# Patient Record
Sex: Female | Born: 1990
Health system: Southern US, Community
[De-identification: ages and names within clinical notes are randomized; demographics above are authoritative.]

## PROBLEM LIST (undated history)

## (undated) ENCOUNTER — Emergency Department (HOSPITAL_COMMUNITY): Admission: EM | Payer: BC Managed Care – PPO

## (undated) DIAGNOSIS — F32A Depression, unspecified: Secondary | ICD-10-CM

## (undated) DIAGNOSIS — J45909 Unspecified asthma, uncomplicated: Secondary | ICD-10-CM

## (undated) DIAGNOSIS — F909 Attention-deficit hyperactivity disorder, unspecified type: Secondary | ICD-10-CM

## (undated) DIAGNOSIS — F329 Major depressive disorder, single episode, unspecified: Secondary | ICD-10-CM

## (undated) HISTORY — DX: Unspecified asthma, uncomplicated: J45.909

## (undated) HISTORY — PX: NO PAST SURGERIES: SHX2092

## (undated) HISTORY — DX: Major depressive disorder, single episode, unspecified: F32.9

## (undated) HISTORY — DX: Depression, unspecified: F32.A

## (undated) HISTORY — DX: Attention-deficit hyperactivity disorder, unspecified type: F90.9

---
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
## 2013-07-25 DIAGNOSIS — F988 Other specified behavioral and emotional disorders with onset usually occurring in childhood and adolescence: Secondary | ICD-10-CM | POA: Insufficient documentation

## 2013-07-25 DIAGNOSIS — F419 Anxiety disorder, unspecified: Secondary | ICD-10-CM | POA: Insufficient documentation

## 2013-07-25 DIAGNOSIS — F32A Depression, unspecified: Secondary | ICD-10-CM | POA: Insufficient documentation

## 2013-07-25 DIAGNOSIS — F329 Major depressive disorder, single episode, unspecified: Secondary | ICD-10-CM | POA: Insufficient documentation

## 2014-07-14 ENCOUNTER — Ambulatory Visit (INDEPENDENT_AMBULATORY_CARE_PROVIDER_SITE_OTHER): Payer: Commercial Managed Care - PPO

## 2014-07-14 ENCOUNTER — Ambulatory Visit (INDEPENDENT_AMBULATORY_CARE_PROVIDER_SITE_OTHER): Payer: Commercial Managed Care - PPO | Admitting: Family Medicine

## 2014-07-14 VITALS — BP 118/62 | HR 113 | Temp 99.2°F | Resp 16 | Wt 126.8 lb

## 2014-07-14 DIAGNOSIS — R509 Fever, unspecified: Secondary | ICD-10-CM

## 2014-07-14 DIAGNOSIS — R0602 Shortness of breath: Secondary | ICD-10-CM

## 2014-07-14 DIAGNOSIS — J101 Influenza due to other identified influenza virus with other respiratory manifestations: Secondary | ICD-10-CM

## 2014-07-14 DIAGNOSIS — R000 Tachycardia, unspecified: Secondary | ICD-10-CM

## 2014-07-14 LAB — POCT CBC
Granulocyte percent: 79.7 %G (ref 37–80)
HEMATOCRIT: 45.8 % (ref 37.7–47.9)
HEMOGLOBIN: 14.8 g/dL (ref 12.2–16.2)
LYMPH, POC: 0.8 (ref 0.6–3.4)
MCH: 28 pg (ref 27–31.2)
MCHC: 32.4 g/dL (ref 31.8–35.4)
MCV: 86.5 fL (ref 80–97)
MID (cbc): 0.2 (ref 0–0.9)
MPV: 7.5 fL (ref 0–99.8)
POC Granulocyte: 3.7 (ref 2–6.9)
POC LYMPH PERCENT: 16.8 %L (ref 10–50)
POC MID %: 3.5 %M (ref 0–12)
Platelet Count, POC: 151 10*3/uL (ref 142–424)
RBC: 5.29 M/uL (ref 4.04–5.48)
RDW, POC: 13.4 %
WBC: 4.6 10*3/uL (ref 4.6–10.2)

## 2014-07-14 LAB — POCT INFLUENZA A/B
Influenza A, POC: NEGATIVE
Influenza B, POC: POSITIVE

## 2014-07-14 MED ORDER — HYDROCOD POLST-CHLORPHEN POLST 10-8 MG/5ML PO LQCR: 5.0000 mL | Freq: Two times a day (BID) | ORAL | Status: DC | PRN

## 2014-07-14 MED ORDER — ALBUTEROL SULFATE HFA 108 (90 BASE) MCG/ACT IN AERS: 2.0000 | INHALATION_SPRAY | RESPIRATORY_TRACT | Status: DC | PRN

## 2014-07-14 MED ORDER — IPRATROPIUM BROMIDE 0.03 % NA SOLN: 2.0000 | Freq: Two times a day (BID) | NASAL | Status: DC

## 2014-07-14 NOTE — Patient Instructions (Signed)
Use cough syrup at night. atrovent nasal spray twice a day for congestion. Albuterol inhaler as needed for shortness of breath. Get plenty of rest and drink plenty of water. Return if not getting better in 5-7 days.

## 2014-07-14 NOTE — Progress Notes (Signed)
Subjective:    Patient ID: Meghan Nolan, female    DOB: [birth year], 24 y.o.   MRN: [id number]  HPI  This is a 24 year old female who is presenting with fever, cough, body aches, fatigue and dizziness x 1 week. Cough is dry but feels she needs to cough something up. Was feeling a little bit better yesterday but worse again today. States she has had some wheezing and SOB. States her mouth feels dry - "I can't get enough water". Has some upper back pain when she coughs. Taking dayquil and nyquil and helping some. Has a history of heat-induced asthma but hasn't had to use an inhaler in 6 years. No sick contacts.   Review of Systems  Constitutional: Positive for fever, chills and fatigue.  HENT: Positive for congestion. Negative for ear pain, sinus pressure and sore throat.   Eyes: Negative for redness.  Respiratory: Positive for cough, shortness of breath and wheezing.   Cardiovascular: Negative for chest pain.  Gastrointestinal: Negative for nausea, vomiting, abdominal pain and diarrhea.  Genitourinary: Negative for dysuria.  Musculoskeletal: Positive for myalgias and back pain.  Skin: Negative for rash.  Allergic/Immunologic: Negative for environmental allergies.  Neurological: Positive for dizziness and headaches.  Hematological: Negative for adenopathy.  Psychiatric/Behavioral: Positive for sleep disturbance.    There are no active problems to display for this patient.  Prior to Admission medications   Medication Sig Start Date End Date Taking? Authorizing Provider  Drospirenone-Ethinyl Estradiol-Levomefol (BEYAZ) 3-0.02-0.451 MG tablet Take 1 tablet by mouth daily.   Yes Historical Provider, MD   Allergies  Allergen Reactions  . Penicillins Shortness Of Breath   Patient's social and family history were reviewed.     Objective:   Physical Exam  Constitutional: She is oriented to person, place, and time. She appears well-developed and well-nourished. No distress.  HENT:    Head: Normocephalic and atraumatic.  Right Ear: Hearing, tympanic membrane, external ear and ear canal normal.  Left Ear: Hearing, tympanic membrane, external ear and ear canal normal.  Nose: Mucosal edema present. Right sinus exhibits no maxillary sinus tenderness and no frontal sinus tenderness. Left sinus exhibits no maxillary sinus tenderness and no frontal sinus tenderness.  Mouth/Throat: Uvula is midline, oropharynx is clear and moist and mucous membranes are normal.  Eyes: Conjunctivae and lids are normal. Right eye exhibits no discharge. Left eye exhibits no discharge. No scleral icterus.  Cardiovascular: Regular rhythm, normal heart sounds and normal pulses.  Tachycardia present.   No murmur heard. Pulmonary/Chest: Effort normal and breath sounds normal. No respiratory distress. She has no wheezes. She has no rhonchi. She has no rales.  Abdominal: Soft. Normal appearance. There is no tenderness.  Musculoskeletal: Normal range of motion.  Lymphadenopathy:       Head (right side): No submental, no submandibular, no tonsillar, no preauricular, no posterior auricular and no occipital adenopathy present.       Head (left side): No submental, no submandibular, no tonsillar, no preauricular, no posterior auricular and no occipital adenopathy present.    She has no cervical adenopathy.  Neurological: She is alert and oriented to person, place, and time.  Skin: Skin is warm, dry and intact. No lesion and no rash noted.  Psychiatric: She has a normal mood and affect. Her speech is normal and behavior is normal. Thought content normal.   BP 118/62 mmHg  Pulse 113  Temp(Src) 99.2 F (37.3 C) (Oral)  Resp 16  Wt 126 lb 12.8 oz (  57.516 kg)  SpO2 99%  LMP 07/10/2014  Pulse decreased to 98 after 1 liter IV fluids  Results for orders placed or performed in visit on 07/14/14  POCT CBC  Result Value Ref Range   WBC 4.6 4.6 - 10.2 K/uL   Lymph, poc 0.8 0.6 - 3.4   POC LYMPH PERCENT 16.8 10  - 50 %L   MID (cbc) 0.2 0 - 0.9   POC MID % 3.5 0 - 12 %M   POC Granulocyte 3.7 2 - 6.9   Granulocyte percent 79.7 37 - 80 %G   RBC 5.29 4.04 - 5.48 M/uL   Hemoglobin 14.8 12.2 - 16.2 g/dL   HCT, POC 16.145.8 09.637.7 - 47.9 %   MCV 86.5 80 - 97 fL   MCH, POC 28.0 27 - 31.2 pg   MCHC 32.4 31.8 - 35.4 g/dL   RDW, POC 04.513.4 %   Platelet Count, POC 151 142 - 424 K/uL   MPV 7.5 0 - 99.8 fL  POCT Influenza A/B  Result Value Ref Range   Influenza A, POC Negative    Influenza B, POC Positive    [facility] reading (PRIMARY) by  Dr. Patsy Lagerop[place]: negative.     Assessment & Plan:  1. Shortness of breath 2. Fever 3. Influenza B Tachycardia, dizziness and SOB improved with 1 liter IV fluids. Chest radiograph negative and cbc wnl.  Pt is out of window where tamiflu would be of benefit. Focus is on supportive care, see meds prescribed below. She will return if symptoms are not improving in 5-7 days.  - DG Chest 2 View; Future - albuterol (PROVENTIL HFA;VENTOLIN HFA) 108 (90 BASE) MCG/ACT inhaler; Inhale 2 puffs into the lungs every 4 (four) hours as needed for wheezing or shortness of breath (cough, shortness of breath or wheezing.).  Dispense: 1 Inhaler; Refill: 1 - POCT CBC - POCT Influenza A/B - chlorpheniramine-HYDROcodone (TUSSIONEX PENNKINETIC ER) 10-8 MG/5ML LQCR; Take 5 mLs by mouth every 12 (twelve) hours as needed for cough (cough).  Dispense: 80 mL; Refill: 0 - ipratropium (ATROVENT) 0.03 % nasal spray; Place 2 sprays into both nostrils 2 (two) times daily.  Dispense: 30 mL; Refill: 0   Nicole V. Dyke BrackettBush, PA-C, MHS [facility]  07/16/2014

## 2015-04-15 NOTE — L&D Delivery Note (Signed)
Operative Delivery Note At 7:38 PM a viable female was delivered via Vaginal, Vacuum Investment banker, operational(Extractor).  Presentation: vertex; Position: Left,, Occiput,, Anterior; Station: +2.  Verbal consent: obtained from patient.  Risks and benefits discussed in detail.  Risks include, but are not limited to the risks of anesthesia, bleeding, infection, damage to maternal tissues, fetal cephalhematoma.  There is also the risk of inability to effect vaginal delivery of the head, or shoulder dystocia that cannot be resolved by established maneuvers, leading to the need for emergency cesarean section.  Vacuum assistance offered as she had been pushing for 2 1/2 hours and FHT baseline gradually increasing with decreasing variability.  Vacuum used for 6 ctx, there were 2 pop-offs, vacuum in the green zone.  APGAR: pending; weight pending.   Placenta status: spontaneous, intact.   Cord:  with the following complications: .  Cord pH: pending  Anesthesia:  Epidural Instruments: Kiwi Episiotomy: None Lacerations: Periurethral Suture Repair: none Est. Blood Loss (mL): 200  Mom to postpartum.  Baby to Nursery.  Will do circumcision in the office.  Kynzie Polgar D 11/20/2015, 7:59 PM

## 2015-05-01 LAB — OB RESULTS CONSOLE ABO/RH: RH TYPE: POSITIVE

## 2015-05-01 LAB — OB RESULTS CONSOLE GC/CHLAMYDIA
Chlamydia: NEGATIVE
Gonorrhea: NEGATIVE

## 2015-05-01 LAB — OB RESULTS CONSOLE RUBELLA ANTIBODY, IGM: RUBELLA: IMMUNE

## 2015-05-01 LAB — OB RESULTS CONSOLE ANTIBODY SCREEN: ANTIBODY SCREEN: NEGATIVE

## 2015-05-01 LAB — OB RESULTS CONSOLE HIV ANTIBODY (ROUTINE TESTING): HIV: NONREACTIVE

## 2015-05-01 LAB — OB RESULTS CONSOLE RPR: RPR: NONREACTIVE

## 2015-05-01 LAB — OB RESULTS CONSOLE HEPATITIS B SURFACE ANTIGEN: Hepatitis B Surface Ag: NEGATIVE

## 2015-10-24 LAB — OB RESULTS CONSOLE GBS: STREP GROUP B AG: NEGATIVE

## 2015-11-15 ENCOUNTER — Telehealth (HOSPITAL_COMMUNITY): Payer: Self-pay | Admitting: *Deleted

## 2015-11-15 ENCOUNTER — Encounter (HOSPITAL_COMMUNITY): Payer: Self-pay | Admitting: *Deleted

## 2015-11-15 NOTE — Telephone Encounter (Signed)
Preadmission screen  

## 2015-11-20 ENCOUNTER — Encounter (HOSPITAL_COMMUNITY): Payer: Self-pay

## 2015-11-20 ENCOUNTER — Inpatient Hospital Stay (HOSPITAL_COMMUNITY)
Admission: RE | Admit: 2015-11-20 | Discharge: 2015-11-22 | DRG: 775 | Disposition: A | Discharge status: Home / Self Care | Payer: Medicaid Other | Source: Ambulatory Visit | Attending: Obstetrics and Gynecology | Admitting: Obstetrics and Gynecology

## 2015-11-20 ENCOUNTER — Inpatient Hospital Stay (HOSPITAL_COMMUNITY): Payer: Medicaid Other | Admitting: Anesthesiology

## 2015-11-20 DIAGNOSIS — Z3A39 39 weeks gestation of pregnancy: Secondary | ICD-10-CM | POA: Diagnosis not present

## 2015-11-20 DIAGNOSIS — J45909 Unspecified asthma, uncomplicated: Secondary | ICD-10-CM | POA: Diagnosis present

## 2015-11-20 DIAGNOSIS — Z3403 Encounter for supervision of normal first pregnancy, third trimester: Secondary | ICD-10-CM | POA: Diagnosis present

## 2015-11-20 DIAGNOSIS — Z87891 Personal history of nicotine dependence: Secondary | ICD-10-CM | POA: Diagnosis not present

## 2015-11-20 DIAGNOSIS — Z3493 Encounter for supervision of normal pregnancy, unspecified, third trimester: Secondary | ICD-10-CM

## 2015-11-20 DIAGNOSIS — Z8249 Family history of ischemic heart disease and other diseases of the circulatory system: Secondary | ICD-10-CM

## 2015-11-20 DIAGNOSIS — Z833 Family history of diabetes mellitus: Secondary | ICD-10-CM | POA: Diagnosis not present

## 2015-11-20 DIAGNOSIS — O9952 Diseases of the respiratory system complicating childbirth: Secondary | ICD-10-CM | POA: Diagnosis present

## 2015-11-20 DIAGNOSIS — Z832 Family history of diseases of the blood and blood-forming organs and certain disorders involving the immune mechanism: Secondary | ICD-10-CM

## 2015-11-20 LAB — CBC
HCT: 38.8 % (ref 36.0–46.0)
Hemoglobin: 13.3 g/dL (ref 12.0–15.0)
MCH: 27.9 pg (ref 26.0–34.0)
MCHC: 34.3 g/dL (ref 30.0–36.0)
MCV: 81.3 fL (ref 78.0–100.0)
PLATELETS: 167 10*3/uL (ref 150–400)
RBC: 4.77 MIL/uL (ref 3.87–5.11)
RDW: 14.3 % (ref 11.5–15.5)
WBC: 9.9 10*3/uL (ref 4.0–10.5)

## 2015-11-20 LAB — TYPE AND SCREEN
ABO/RH(D): O POS
ANTIBODY SCREEN: NEGATIVE

## 2015-11-20 LAB — ABO/RH: ABO/RH(D): O POS

## 2015-11-20 MED ORDER — MAGNESIUM HYDROXIDE 400 MG/5ML PO SUSP: 30.0000 mL | ORAL | Status: DC | PRN

## 2015-11-20 MED ORDER — OXYCODONE HCL 5 MG PO TABS
5.0000 mg | ORAL_TABLET | ORAL | Status: DC | PRN
  Administered 2015-11-20 – 2015-11-21 (×4): 5 mg via ORAL
  Filled 2015-11-20 (×3): qty 1

## 2015-11-20 MED ORDER — LACTATED RINGERS IV SOLN: 500.0000 mL | Freq: Once | INTRAVENOUS | Status: DC

## 2015-11-20 MED ORDER — LACTATED RINGERS IV SOLN
INTRAVENOUS | Status: DC
  Administered 2015-11-20 (×2): via INTRAVENOUS

## 2015-11-20 MED ORDER — ONDANSETRON HCL 4 MG/2ML IJ SOLN: 4.0000 mg | INTRAMUSCULAR | Status: DC | PRN

## 2015-11-20 MED ORDER — EPHEDRINE 5 MG/ML INJ
10.0000 mg | INTRAVENOUS | Status: DC | PRN
Start: 2015-11-20 — End: 2015-11-20
  Filled 2015-11-20: qty 4

## 2015-11-20 MED ORDER — FENTANYL 2.5 MCG/ML BUPIVACAINE 1/10 % EPIDURAL INFUSION (WH - ANES)
14.0000 mL/h | INTRAMUSCULAR | Status: DC | PRN
  Administered 2015-11-20 (×2): 14 mL/h via EPIDURAL
  Filled 2015-11-20 (×2): qty 125

## 2015-11-20 MED ORDER — BENZOCAINE-MENTHOL 20-0.5 % EX AERO
1.0000 "application " | INHALATION_SPRAY | CUTANEOUS | Status: DC | PRN
  Filled 2015-11-20: qty 56

## 2015-11-20 MED ORDER — TERBUTALINE SULFATE 1 MG/ML IJ SOLN
0.2500 mg | Freq: Once | INTRAMUSCULAR | Status: DC | PRN
  Filled 2015-11-20: qty 1

## 2015-11-20 MED ORDER — ACETAMINOPHEN 325 MG PO TABS
650.0000 mg | ORAL_TABLET | ORAL | Status: DC | PRN
  Administered 2015-11-20: 650 mg via ORAL
  Filled 2015-11-20: qty 2

## 2015-11-20 MED ORDER — SOD CITRATE-CITRIC ACID 500-334 MG/5ML PO SOLN: 30.0000 mL | ORAL | Status: DC | PRN

## 2015-11-20 MED ORDER — PHENYLEPHRINE 40 MCG/ML (10ML) SYRINGE FOR IV PUSH (FOR BLOOD PRESSURE SUPPORT)
80.0000 ug | PREFILLED_SYRINGE | INTRAVENOUS | Status: DC | PRN
  Filled 2015-11-20: qty 5

## 2015-11-20 MED ORDER — OXYCODONE HCL 5 MG PO TABS
10.0000 mg | ORAL_TABLET | ORAL | Status: DC | PRN
Start: 2015-11-20 — End: 2015-11-22
  Filled 2015-11-20: qty 2

## 2015-11-20 MED ORDER — IBUPROFEN 600 MG PO TABS
600.0000 mg | ORAL_TABLET | Freq: Four times a day (QID) | ORAL | Status: DC
  Administered 2015-11-20 – 2015-11-22 (×7): 600 mg via ORAL
  Filled 2015-11-20 (×7): qty 1

## 2015-11-20 MED ORDER — ZOLPIDEM TARTRATE 5 MG PO TABS: 5.0000 mg | ORAL_TABLET | Freq: Every evening | ORAL | Status: DC | PRN

## 2015-11-20 MED ORDER — WITCH HAZEL-GLYCERIN EX PADS: 1.0000 "application " | MEDICATED_PAD | CUTANEOUS | Status: DC | PRN

## 2015-11-20 MED ORDER — DIBUCAINE 1 % RE OINT: 1.0000 "application " | TOPICAL_OINTMENT | RECTAL | Status: DC | PRN

## 2015-11-20 MED ORDER — COCONUT OIL OIL: 1.0000 "application " | TOPICAL_OIL | Status: DC | PRN

## 2015-11-20 MED ORDER — OXYCODONE-ACETAMINOPHEN 5-325 MG PO TABS: 2.0000 | ORAL_TABLET | ORAL | Status: DC | PRN

## 2015-11-20 MED ORDER — ONDANSETRON HCL 4 MG PO TABS: 4.0000 mg | ORAL_TABLET | ORAL | Status: DC | PRN

## 2015-11-20 MED ORDER — EPHEDRINE 5 MG/ML INJ
10.0000 mg | INTRAVENOUS | Status: DC | PRN
  Filled 2015-11-20: qty 4

## 2015-11-20 MED ORDER — OXYCODONE-ACETAMINOPHEN 5-325 MG PO TABS: 1.0000 | ORAL_TABLET | ORAL | Status: DC | PRN

## 2015-11-20 MED ORDER — TETANUS-DIPHTH-ACELL PERTUSSIS 5-2.5-18.5 LF-MCG/0.5 IM SUSP
0.5000 mL | Freq: Once | INTRAMUSCULAR | Status: DC
Start: 2015-11-21 — End: 2015-11-22

## 2015-11-20 MED ORDER — PRENATAL MULTIVITAMIN CH
1.0000 | ORAL_TABLET | Freq: Every day | ORAL | Status: DC
  Administered 2015-11-21 – 2015-11-22 (×2): 1 via ORAL
  Filled 2015-11-20 (×2): qty 1

## 2015-11-20 MED ORDER — BUTORPHANOL TARTRATE 1 MG/ML IJ SOLN: 1.0000 mg | INTRAMUSCULAR | Status: DC | PRN

## 2015-11-20 MED ORDER — MEASLES, MUMPS & RUBELLA VAC ~~LOC~~ INJ: 0.5000 mL | INJECTION | Freq: Once | SUBCUTANEOUS | Status: DC

## 2015-11-20 MED ORDER — PHENYLEPHRINE 40 MCG/ML (10ML) SYRINGE FOR IV PUSH (FOR BLOOD PRESSURE SUPPORT)
80.0000 ug | PREFILLED_SYRINGE | INTRAVENOUS | Status: DC | PRN
  Filled 2015-11-20: qty 5
  Filled 2015-11-20: qty 10

## 2015-11-20 MED ORDER — OXYTOCIN 40 UNITS IN LACTATED RINGERS INFUSION - SIMPLE MED
1.0000 m[IU]/min | INTRAVENOUS | Status: DC
  Administered 2015-11-20: 2 m[IU]/min via INTRAVENOUS
  Filled 2015-11-20: qty 1000

## 2015-11-20 MED ORDER — LIDOCAINE HCL (PF) 1 % IJ SOLN
30.0000 mL | INTRAMUSCULAR | Status: DC | PRN
  Filled 2015-11-20: qty 30

## 2015-11-20 MED ORDER — SIMETHICONE 80 MG PO CHEW: 80.0000 mg | CHEWABLE_TABLET | ORAL | Status: DC | PRN

## 2015-11-20 MED ORDER — LIDOCAINE HCL (PF) 1 % IJ SOLN
INTRAMUSCULAR | Status: DC | PRN
  Administered 2015-11-20 (×2): 5 mL

## 2015-11-20 MED ORDER — DIPHENHYDRAMINE HCL 25 MG PO CAPS: 25.0000 mg | ORAL_CAPSULE | Freq: Four times a day (QID) | ORAL | Status: DC | PRN

## 2015-11-20 MED ORDER — ONDANSETRON HCL 4 MG/2ML IJ SOLN: 4.0000 mg | Freq: Four times a day (QID) | INTRAMUSCULAR | Status: DC | PRN

## 2015-11-20 MED ORDER — LACTATED RINGERS IV SOLN: 500.0000 mL | INTRAVENOUS | Status: DC | PRN

## 2015-11-20 MED ORDER — SENNOSIDES-DOCUSATE SODIUM 8.6-50 MG PO TABS
2.0000 | ORAL_TABLET | ORAL | Status: DC
  Administered 2015-11-21 – 2015-11-22 (×2): 2 via ORAL
  Filled 2015-11-20 (×2): qty 2

## 2015-11-20 MED ORDER — DIPHENHYDRAMINE HCL 50 MG/ML IJ SOLN: 12.5000 mg | INTRAMUSCULAR | Status: DC | PRN

## 2015-11-20 MED ORDER — OXYTOCIN 40 UNITS IN LACTATED RINGERS INFUSION - SIMPLE MED: 2.5000 [IU]/h | INTRAVENOUS | Status: DC

## 2015-11-20 MED ORDER — ACETAMINOPHEN 325 MG PO TABS: 650.0000 mg | ORAL_TABLET | ORAL | Status: DC | PRN

## 2015-11-20 MED ORDER — OXYTOCIN BOLUS FROM INFUSION
500.0000 mL | Freq: Once | INTRAVENOUS | Status: AC
  Administered 2015-11-20: 500 mL via INTRAVENOUS

## 2015-11-20 MED ORDER — METHYLERGONOVINE MALEATE 0.2 MG/ML IJ SOLN
0.2000 mg | INTRAMUSCULAR | Status: DC | PRN
Start: 2015-11-20 — End: 2015-11-22

## 2015-11-20 MED ORDER — METHYLERGONOVINE MALEATE 0.2 MG PO TABS: 0.2000 mg | ORAL_TABLET | ORAL | Status: DC | PRN

## 2015-11-20 NOTE — Anesthesia Pain Management Evaluation Note (Signed)
  CRNA Pain Management Visit Note  Patient: Meghan ElksJamie L Desena, 25 y.o., female  "Hello I am a [facility] the [facility]. We have an anesthesia team available at all times to provide care throughout the hospital, including epidural management and anesthesia for C-section. I don't know your plan for the delivery whether it a natural birth, water birth, IV sedation, nitrous supplementation, doula or epidural, but we want to meet your pain goals."   1.Was your pain managed to your expectations on prior hospitalizations?   No prior hospitalizations  2.What is your expectation for pain management during this hospitalization?     Epidural  3.How can we help you reach that goal? epidural  Record the patient's initial score and the patient's pain goal.   Pain: 0  Pain Goal: 5 [facility] wants you to be able to say your pain was always managed very well.  [name] 11/20/2015

## 2015-11-20 NOTE — Anesthesia Procedure Notes (Signed)
Epidural Patient location during procedure: OB  Staffing Anesthesiologist: Jullian Clayson Performed: anesthesiologist   Preanesthetic Checklist Completed: patient identified, site marked, surgical consent, pre-op evaluation, timeout performed, IV checked, risks and benefits discussed and monitors and equipment checked  Epidural Patient position: sitting Prep: DuraPrep Patient monitoring: heart rate, continuous pulse ox and blood pressure Approach: right paramedian Location: L3-L4 Injection technique: LOR saline  Needle:  Needle type: Tuohy  Needle gauge: 17 G Needle length: 9 cm and 9 Needle insertion depth: 6 cm Catheter type: closed end flexible Catheter size: 20 Guage Catheter at skin depth: 10 cm Test dose: negative  Assessment Events: blood not aspirated, injection not painful, no injection resistance, negative IV test and no paresthesia  Additional Notes Patient identified. Risks/Benefits/Options discussed with patient including but not limited to bleeding, infection, nerve damage, paralysis, failed block, incomplete pain control, headache, blood pressure changes, nausea, vomiting, reactions to medication both or allergic, itching and postpartum back pain. Confirmed with bedside nurse the patient's most recent platelet count. Confirmed with patient that they are not currently taking any anticoagulation, have any bleeding history or any family history of bleeding disorders. Patient expressed understanding and wished to proceed. All questions were answered. Sterile technique was used throughout the entire procedure. Please see nursing notes for vital signs. Test dose was given through epidural needle and negative prior to continuing to dose epidural or start infusion. Warning signs of high block given to the patient including shortness of breath, tingling/numbness in hands, complete motor block, or any concerning symptoms with instructions to call for help. Patient was given  instructions on fall risk and not to get out of bed. All questions and concerns addressed with instructions to call with any issues.     

## 2015-11-20 NOTE — Consult Note (Signed)
Neonatology Note:  Attendance at Code Apgar:   Our team responded to a Code Apgar call to room # 168 following vacuum-assisted vaginal delivery, due to infant with very poor tone and slow to breathe. The requesting physician was Dr. Meisinger. The mother is a G1P0 O pos, GBS neg with an uncomplicated pregnancy. ROM occurred 11 hours PTD and the fluid was clear. There had not been fetal distress, but there was a prolonged second stage of labor, fetal tachycardia to the low 200s, and, after delivery of the head, it took about 30 seconds before the body was delivered. At delivery, the baby was floppy. The OB nursing staff in attendance gave vigorous stimulation and gave BBO2, and a Code Apgar was called. Our team arrived at 4 minutes of life, at which time the baby was breathing regularly, had a normal HR, and was perfusing well, with pink color in BBO2. His muscle tone was very poor, and he had little response to bulb suctioning, with his eyes closed throughout. I examined him, obtained a history, and observed him until 15 minutes of life. Pulse oximetry showed his O2 saturations were in the mid 90s in room air, and his lungs were clear to auscultation. No history of maternally administered magnesium sulfate, pain medications other than epidural; her highest temperature during labor was 37.2 degrees, without suspicion for infection. Ap 4/5/6.  I spoke with the parents in the DR, leaving the pulse oximeter on the baby while he had skin to skin time. I asked his nurse to check a POCT glucose at 1 hour. I will also go back to check the baby in 30 minutes, to make sure his tone is improving. Transferred the baby to the Pediatrician's care.   Zhaire Locker C. Kamyia Thomason, MD 

## 2015-11-20 NOTE — Progress Notes (Signed)
Comfortable with epidural Afeb, VSS, BP a bit labile FHT- Cat I VE-6/90/-1, vtx Continue pitocin, monitor progress, anticipate SVD

## 2015-11-20 NOTE — H&P (Signed)
Meghan ElksJamie L Nolan is a 25 y.o. female, G1P0, EGA 39+ weeks with EDC 8-11 presenting for elective induction.  Prenatal care uncomplicated.  OB History    Gravida Para Term Preterm AB Living   1             SAB TAB Ectopic Multiple Live Births                 Past Medical History:  Diagnosis Date  . Asthma   . Depression    Past Surgical History:  Procedure Laterality Date  . NO PAST SURGERIES     Family History: family history includes Bleeding Disorder in her father; Diabetes in her mother; Heart disease in her father; Learning disabilities in her brother; Neurofibromatosis in her brother. Social History:  reports that she quit smoking about 4 months ago. She has never used smokeless tobacco. She reports that she does not drink alcohol or use drugs.     Maternal Diabetes: No Genetic Screening: Normal Maternal Ultrasounds/Referrals: Normal Fetal Ultrasounds or other Referrals:  None Maternal Substance Abuse:  No Significant Maternal Medications:  None Significant Maternal Lab Results:  Lab values include: Group B Strep negative Other Comments:  None  Review of Systems  Respiratory: Negative.   Cardiovascular: Negative.    Maternal Medical History:  Fetal activity: Perceived fetal activity is normal.    Prenatal complications: no prenatal complications  AROM-clear Dilation: 2.5 Effacement (%): 60, 70 Station: -3 Exam by:: Gifford ShaveYancey Luft RN Blood pressure 129/80, pulse 94, temperature 97.6 F (36.4 C), temperature source Oral, resp. rate 18, height 5\' 7"  (1.702 m), weight 87.1 kg (192 lb), last menstrual period 02/16/2015, SpO2 100 %. Maternal Exam:  Uterine Assessment: Contraction strength is mild.  Contraction frequency is irregular.   Abdomen: Patient reports no abdominal tenderness. Estimated fetal weight is 7 1/2 lbs.   Fetal presentation: vertex  Introitus: Normal vulva. Normal vagina.  Amniotic fluid character: clear.  Pelvis: adequate for delivery.   Cervix:  Cervix evaluated by digital exam.     Fetal Exam Fetal Monitor Review: Mode: ultrasound.   Baseline rate: 130.  Variability: moderate (6-25 bpm).   Pattern: accelerations present and no decelerations.    Fetal State Assessment: Category I - tracings are normal.     Physical Exam  Vitals reviewed. Constitutional: She appears well-developed and well-nourished.  GI: Soft.    Prenatal labs: ABO, Rh: --/--/O POS (08/08 0720) Antibody: NEG (08/08 0720) Rubella: Immune (01/17 0000) RPR: Nonreactive (01/17 0000)  HBsAg: Negative (01/17 0000)  HIV: Non-reactive (01/17 0000)  GBS: Negative (07/12 0000)   Assessment/Plan: IUP at 39+ weeks with favorable cervix for elective induction.  Pitocin has been started, AROM done, will monitor progress, anticipate SVD.   Marshal Eskew D 11/20/2015, 8:43 AM

## 2015-11-20 NOTE — Anesthesia Preprocedure Evaluation (Signed)

## 2015-11-21 LAB — RPR: RPR: NONREACTIVE

## 2015-11-21 NOTE — Progress Notes (Signed)
MOB was referred for history of depression/anxiety. * Referral screened out by Clinical Social Worker because none of the following criteria appear to apply: ~ History of anxiety/depression during this pregnancy, or of post-partum depression. ~ Diagnosis of anxiety and/or depression within last 3 years OR * MOB's symptoms currently being treated with medication and/or therapy. Please contact the Clinical Social Worker if needs arise, or if MOB requests.  CSW reviewed chart and MOB was dx with depression at age 25 and has not been on medication.   Blaine HamperAngel Boyd-Gilyard, MSW, LCSW Clinical Social Work [phone]

## 2015-11-21 NOTE — Progress Notes (Signed)
Post Partum Day 1 Subjective: no complaints, up ad lib and voiding  Baby at breast and latching well  Objective: Blood pressure 137/64, pulse 98, temperature 98.4 F (36.9 C), temperature source Oral, resp. rate 16, height 5\' 7"  (1.702 m), weight 87.1 kg (192 lb), last menstrual period 02/16/2015, SpO2 100 %, unknown if currently breastfeeding.  Physical Exam:  General: alert and cooperative Lochia: appropriate Uterine Fundus: firm    Recent Labs  11/20/15 0720  HGB 13.3  HCT 38.8    Assessment/Plan: Plan for discharge tomorrow D/w pt circumcision and plans in office  LOS: 1 day   Meghan Nolan W 11/21/2015, 10:35 AM

## 2015-11-21 NOTE — Anesthesia Postprocedure Evaluation (Signed)
Anesthesia Post Note  Patient: Meghan ElksJamie L Kanode  Procedure(s) Performed: * No procedures listed *  Patient location during evaluation: Mother Baby Anesthesia Type: Epidural Level of consciousness: awake and alert Pain management: pain level controlled Vital Signs Assessment: post-procedure vital signs reviewed and stable Respiratory status: spontaneous breathing, nonlabored ventilation and respiratory function stable Cardiovascular status: stable Postop Assessment: no headache, no backache and epidural receding Anesthetic complications: no     Last Vitals:  Vitals:   11/20/15 2300 11/21/15 0333  BP: 137/60 137/64  Pulse: 99 98  Resp: 16 16  Temp: 36.7 C 36.9 C    Last Pain:  Vitals:   11/21/15 0524  TempSrc:   PainSc: 3    Pain Goal: Patients Stated Pain Goal: 2 (11/21/15 0524)               Junious SilkGILBERT,Ohanna Gassert

## 2015-11-21 NOTE — Lactation Note (Signed)
This note was copied from a baby's chart. Lactation Consultation Note  Patient Name: Meghan Nolan ZOXWR'UToday's Date: 11/21/2015 Reason for consult: Initial assessment Initial visit done.  This is mom's first baby.  Delivery was a difficult vacuum extraction.  Mom states baby is latching easily and well.  Baby just finished a feeding and is relaxed and content.  Encouraged to watch for feeding cues and to call for assist/concerns prn.  Maternal Data    Feeding    LATCH Score/Interventions                      Lactation Tools Discussed/Used     Consult Status Consult Status: Follow-up Date: 11/22/15 Follow-up type: In-patient    Huston FoleyMOULDEN, Larita Deremer S 11/21/2015, 4:01 PM

## 2015-11-22 MED ORDER — IBUPROFEN 600 MG PO TABS: 600.0000 mg | ORAL_TABLET | Freq: Four times a day (QID) | ORAL | 1 refills | Status: DC

## 2015-11-22 NOTE — Discharge Summary (Addendum)
OB Discharge Summary     Patient Name: Meghan ElksJamie L Howerter DOB: 1990-08-04 MRN: [id number]  Date of admission: 11/20/2015 Delivering MD: Jackelyn KnifeMEISINGER, TODD   Date of discharge: 11/22/2015  Admitting diagnosis: INDUCTION Intrauterine pregnancy: 8180w4d     Secondary diagnosis:  Active Problems:   Normal pregnancy in third trimester  Additional problems: none     Discharge diagnosis: Term Pregnancy Delivered                                                                                                Post partum procedures:none  Augmentation: AROM and Pitocin  Complications: vacuum assisted delivery  Hospital course:  Induction of Labor With Vaginal Delivery   25 y.o. yo G1P1001 at 5380w4d was admitted to the hospital 11/20/2015 for induction of labor.  Indication for induction: elective.  Patient had an uncomplicated labor course as follows: Membrane Rupture Time/Date: 8:35 AM ,11/20/2015   Intrapartum Procedures: Episiotomy: None [1]                                         Lacerations:  Periurethral [8]  Patient had delivery of a Viable infant.  Information for the patient's newborn:  Eda KeysVestal, Boy Subrina [[id number]][[id number]]      11/20/2015  Details of delivery can be found in separate delivery note.  Patient had a routine postpartum course. Patient is discharged home 11/22/15.   Physical exam Vitals:   11/21/15 1100 11/21/15 1754 11/22/15 0557 11/22/15 0821  BP: 124/76 138/62 (!) 97/48 111/68  Pulse: 77 (!) 101 91 99  Resp: 18 17 18    Temp: 98.7 F (37.1 C) 99.1 F (37.3 C) 98.6 F (37 C)   TempSrc: Oral Oral Oral   SpO2:      Weight:      Height:       General: alert, cooperative and no distress Lochia: appropriate Uterine Fundus: firm Incision: N/A DVT Evaluation: No evidence of DVT seen on physical exam. No significant calf/ankle edema. Labs: Lab Results  Component Value Date   WBC 9.9 11/20/2015   HGB 13.3 11/20/2015   HCT 38.8 11/20/2015   MCV 81.3 11/20/2015   PLT 167  11/20/2015   No flowsheet data found.  Discharge instruction: per After Visit Summary and "Baby and Me Booklet".  After visit meds:    Medication List    TAKE these medications   calcium carbonate 500 MG chewable tablet Commonly known as:  TUMS - dosed in mg elemental calcium Chew 1 tablet by mouth daily as needed for indigestion or heartburn.   ibuprofen 600 MG tablet Commonly known as:  ADVIL,MOTRIN Take 1 tablet (600 mg total) by mouth every 6 (six) hours.       Diet: routine diet  Activity: Advance as tolerated. Pelvic rest for 6 weeks.   Outpatient follow up:6 weeks Follow up Appt:No future appointments. Follow up Visit:No Follow-up on file.  Postpartum contraception: Undecided  Newborn Data: Live born female  Birth Weight: 8 lb 7 oz (  3827 g) APGAR: 4, 5  Baby Feeding: Breast Disposition:rooming in   11/22/2015 [facility], DO

## 2015-11-22 NOTE — Discharge Instructions (Signed)
Nothing in vagina for 6 weeks.  No sex, tampons, and douching.  Other instructions as in [facility] Discharge Booklet. °

## 2015-11-22 NOTE — Lactation Note (Addendum)
This note was copied from a baby's chart. Lactation Consultation Note  Patient Name: Meghan Marylee FlorasJamie Cobbs ZOXWR'UToday's Date: 11/22/2015 Reason for consult: Follow-up assessment Baby 36 hours old. Baby nursing well at right breast in a modified laid back position. Baby latched deeply, suckling rhythmically with lips flanged and intermittent swallows noted. Enc mom to nurse through the night, discussing with mom that baby's get up to 50% of their calories through the night. Enc offering lots of STS and nursing with cues. Discussed nursing/pumping and returning to work, and [employer]BP purchase options. Enc mom to check with WIC first as mom states that she has pregnancy Medicaid. Discussed milk coming to volume, and referred mom to Baby and Me booklet for number of diapers to expect by day of life and EBM storage guidelines. Mom aware of OP/BFSG and LC phone line assistance after D/C.   Maternal Data    Feeding Feeding Type: Breast Fed Length of feed: 20 min  LATCH Score/Interventions Latch: Grasps breast easily, tongue down, lips flanged, rhythmical sucking.  Audible Swallowing: Spontaneous and intermittent  Type of Nipple: Everted at rest and after stimulation  Comfort (Breast/Nipple): Soft / non-tender     Hold (Positioning): No assistance needed to correctly position infant at breast.  LATCH Score: 10  Lactation Tools Discussed/Used     Consult Status Consult Status: PRN    Geralynn OchsWILLIARD, Lai Hendriks 11/22/2015, 9:01 AM

## 2015-11-22 NOTE — Progress Notes (Addendum)
Post Partum Day 2 Subjective: up ad lib, voiding, tolerating PO and crampy with breastfeeds. Would like to stay as baby not being discharged to home today - bili lights. Breastfeeding  Objective: Blood pressure 111/68, pulse 99, temperature 98.6 F (37 C), temperature source Oral, resp. rate 18, height 5\' 7"  (1.702 m), weight 192 lb (87.1 kg), last menstrual period 02/16/2015, SpO2 100 %, unknown if currently breastfeeding.  Physical Exam:  General: alert, cooperative and no distress Lochia: appropriate Uterine Fundus: firm Incision: n/a DVT Evaluation: No evidence of DVT seen on physical exam. No significant calf/ankle edema.   Recent Labs  11/20/15 0720  HGB 13.3  HCT 38.8    Assessment/Plan: Discharge home, Breastfeeding and Contraception unsure; will nest overnight   LOS: 2 days   Sharol Givenecilia Worema Banga 11/22/2015, 10:07 AM

## 2015-11-23 ENCOUNTER — Ambulatory Visit: Payer: Self-pay

## 2015-11-23 ENCOUNTER — Inpatient Hospital Stay (HOSPITAL_COMMUNITY): Admission: AD | Admit: 2015-11-23 | Payer: Self-pay | Source: Ambulatory Visit | Admitting: Obstetrics and Gynecology

## 2015-11-23 NOTE — Lactation Note (Signed)
This note was copied from a baby's chart. Lactation Consultation Note  Baby latches easily.  Sucks and swallows observed. Mother's breasts are engorged.  Provided ice packs and hand pump and reviewed engorgement care including massage. Mom encouraged to feed baby 8-12 times/24 hours and with feeding cues.  Due to jaundice - explained to parents to be proactive with feedings - waking to breastfeed, be sure to wake between breasts, burp, change diaper. Parents seem confident with feedings.  Praised them for their efforts. Monitor voids/stools per day of life.  Call if they have further questions.  Patient Name: Meghan Marylee FlorasJamie Mccutcheon AVWUJ'WToday's Date: 11/23/2015 Reason for consult: Follow-up assessment   Maternal Data    Feeding Feeding Type: Breast Fed Length of feed: 15 min  LATCH Score/Interventions Latch: Grasps breast easily, tongue down, lips flanged, rhythmical sucking.  Audible Swallowing: Spontaneous and intermittent  Type of Nipple: Everted at rest and after stimulation  Comfort (Breast/Nipple): Filling, red/small blisters or bruises, mild/mod discomfort  Problem noted: Mild/Moderate discomfort;Filling Interventions (Filling): Massage;Reverse pressure;Hand pump Interventions (Mild/moderate discomfort):  (ice)  Hold (Positioning): No assistance needed to correctly position infant at breast.  LATCH Score: 9  Lactation Tools Discussed/Used     Consult Status Consult Status: Complete    Hardie PulleyBerkelhammer, Ruth Boschen 11/23/2015, 9:29 AM

## 2016-01-01 ENCOUNTER — Other Ambulatory Visit: Payer: Self-pay | Admitting: Obstetrics and Gynecology

## 2016-01-01 DIAGNOSIS — N6310 Unspecified lump in the right breast, unspecified quadrant: Secondary | ICD-10-CM

## 2016-01-17 ENCOUNTER — Ambulatory Visit
Admission: RE | Admit: 2016-01-17 | Discharge: 2016-01-17 | Disposition: A | Discharge status: Home / Self Care | Payer: Medicaid Other | Source: Ambulatory Visit | Attending: Obstetrics and Gynecology | Admitting: Obstetrics and Gynecology

## 2016-01-17 ENCOUNTER — Other Ambulatory Visit: Payer: Self-pay | Admitting: Obstetrics and Gynecology

## 2016-01-17 DIAGNOSIS — N6310 Unspecified lump in the right breast, unspecified quadrant: Secondary | ICD-10-CM

## 2016-01-24 ENCOUNTER — Other Ambulatory Visit: Payer: Medicaid Other

## 2018-11-24 ENCOUNTER — Encounter: Payer: Self-pay | Admitting: Registered Nurse

## 2018-11-24 ENCOUNTER — Ambulatory Visit (INDEPENDENT_AMBULATORY_CARE_PROVIDER_SITE_OTHER): Payer: No Typology Code available for payment source | Admitting: Registered Nurse

## 2018-11-24 ENCOUNTER — Other Ambulatory Visit: Payer: Self-pay

## 2018-11-24 VITALS — BP 114/70 | HR 83 | Temp 98.8°F | Resp 16 | Ht 65.95 in | Wt 159.0 lb

## 2018-11-24 DIAGNOSIS — Z13220 Encounter for screening for lipoid disorders: Secondary | ICD-10-CM | POA: Diagnosis not present

## 2018-11-24 DIAGNOSIS — Z130 Encounter for screening for diseases of the blood and blood-forming organs and certain disorders involving the immune mechanism: Secondary | ICD-10-CM

## 2018-11-24 DIAGNOSIS — F988 Other specified behavioral and emotional disorders with onset usually occurring in childhood and adolescence: Secondary | ICD-10-CM | POA: Diagnosis not present

## 2018-11-24 DIAGNOSIS — Z13228 Encounter for screening for other metabolic disorders: Secondary | ICD-10-CM

## 2018-11-24 DIAGNOSIS — Z7689 Persons encountering health services in other specified circumstances: Secondary | ICD-10-CM

## 2018-11-24 DIAGNOSIS — Z1329 Encounter for screening for other suspected endocrine disorder: Secondary | ICD-10-CM | POA: Diagnosis not present

## 2018-11-24 MED ORDER — LISDEXAMFETAMINE DIMESYLATE 10 MG PO CAPS: ORAL_CAPSULE | ORAL | 0 refills | Status: DC

## 2018-11-24 NOTE — Progress Notes (Signed)
New Patient Office Visit  Subjective:  Patient ID: Meghan Nolan, female    DOB: Jun 14, 1990  Age: 28 y.o. MRN: [id number]  CC:  Chief Complaint  Patient presents with  . Establish Care    pt needs pcp to manage ADD and start back on medication   . ADD    HPI Meghan Nolan presents for visit to establish care.   She has a history of ADD for which she has taken Vyvanse with good effect. She is interested in restarting this today. PDMP consulted, no current prescriptions.  Otherwise, she states that she has not had insurance in some time, as such[facility]. 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 She will return to our clinic to have a CPE and pap in September.   Otherwise, no complaints today. Overall feels healthy. Somewhat stressed by pandemic. Unfortunately went through a break up prior to the pandemic - but luckily her 713 yo son is doing well.   Past Medical History:  Diagnosis Date  . ADHD (attention deficit hyperactivity disorder)   . Asthma   . Depression     Past Surgical History:  Procedure Laterality Date  . NO PAST SURGERIES      Family History  Problem Relation Age of Onset  . Diabetes Mother   . Breast cancer Mother   . Colon polyps Mother   . Heart disease Father   . Bleeding Disorder Father   . Diabetes Father   . Neurofibromatosis Brother   . Learning disabilities Brother     Social History   Socioeconomic History  . Marital status: Single    Spouse name: Not on file  . Number of children: 1  . Years of education: Not on file  . Highest education level: Not on file  Occupational History  . Not on file  Social Needs  . Financial resource strain: Not hard at all  . Food insecurity    Worry: Never true    Inability: Never true  . Transportation needs    Medical: No    Non-medical: No  Tobacco Use  . Smoking status: Former Smoker    Quit date: 07/16/2015    Years since  quitting: 3.3  . Smokeless tobacco: Never Used  Substance and Sexual Activity  . Alcohol use: No    Alcohol/week: 0.0 standard drinks  . Drug use: No  . Sexual activity: Not Currently  Lifestyle  . Physical activity    Days per week: 5 days    Minutes per session: 30 min  . Stress: To some extent  Relationships  . Social Musicianconnections    Talks on phone: Three times a week    Gets together: Twice a week    Attends religious service: Patient refused    Active member of club or organization: Patient refused    Attends meetings of clubs or organizations: Patient refused    Relationship status: Patient refused  . Intimate partner violence    Fear of current or ex partner: No    Emotionally abused: No    Physically abused: No    Forced sexual activity: No  Other Topics Concern  . Not on file  Social History Narrative  . Not on file    ROS Review of Systems  Constitutional: Negative.   HENT: Negative.   Eyes: Negative.   Respiratory: Negative.   Cardiovascular: Negative.   Gastrointestinal:  Negative.   Endocrine: Negative.   Genitourinary: Negative.   Musculoskeletal: Negative.   Skin: Negative.   Allergic/Immunologic: Negative.   Neurological: Negative.   Hematological: Negative.   Psychiatric/Behavioral: Positive for decreased concentration. The patient is nervous/anxious and is hyperactive.   All other systems reviewed and are negative.   Objective:   Today's Vitals: BP 114/70   Pulse 83   Temp 98.8 F (37.1 C) (Oral)   Resp 16   Ht 5' 5.95" (1.675 m)   Wt 159 lb (72.1 kg)   SpO2 96%   BMI 25.71 kg/m   Physical Exam Vitals signs and nursing note reviewed.  Constitutional:      General: She is not in acute distress.    Appearance: She is normal weight. She is not ill-appearing, toxic-appearing or diaphoretic.  Cardiovascular:     Rate and Rhythm: Regular rhythm.  Pulmonary:     Effort: Pulmonary effort is normal. No respiratory distress.  Neurological:      Mental Status: She is alert and oriented to person, place, and time. Mental status is at baseline.  Psychiatric:        Mood and Affect: Mood normal.        Behavior: Behavior normal.        Thought Content: Thought content normal.        Judgment: Judgment normal.     Assessment & Plan:   Problem List Items Addressed This Visit      Other   ADD (attention deficit disorder) - Primary   Relevant Medications   lisdexamfetamine (VYVANSE) 10 MG capsule      Outpatient Encounter Medications as of 11/24/2018  Medication Sig  . ibuprofen (ADVIL,MOTRIN) 600 MG tablet Take 1 tablet (600 mg total) by mouth every 6 (six) hours.  Marland Kitchen lisdexamfetamine (VYVANSE) 10 MG capsule Take 1 PO daily in AM. May take 1 PO around noon if effect is limited.  . medroxyPROGESTERone Acetate (DEPO-PROVERA) 150 MG/ML SUSY Depo-Provera 150 mg/mL intramuscular syringe  Inject 1 mL every 3 months by intramuscular route for 90 days.  . [DISCONTINUED] calcium carbonate (TUMS - DOSED IN MG ELEMENTAL CALCIUM) 500 MG chewable tablet Chew 1 tablet by mouth daily as needed for indigestion or heartburn.  . [DISCONTINUED] lisdexamfetamine (VYVANSE) 10 MG capsule Take 3 po daily in AM   No facility-administered encounter medications on file as of 11/24/2018.     Follow-up: Return in about 5 weeks (around 12/29/2018) for CPE with Pap.   PLAN  Labs drawn, will follow up as warranted.  Restarted Vyvanse. Will start at 10mg  PO qd in AM for 1 week for tolerance. Suggest that she may take an additional tab around noon should she feel the effect waning after first week. Will follow up at CPE in September and discuss increasing dose at that time.  Patient encouraged to call clinic with any questions, comments, or concerns.   Maximiano Coss, NP

## 2018-11-24 NOTE — Patient Instructions (Signed)
° ° ° °  If you have lab work done today you will be contacted with your lab results within the next 2 weeks.  If you have not heard from us then please contact us. The fastest way to get your results is to register for My Chart. ° ° °IF you received an x-ray today, you will receive an invoice from [facility]. Please contact [facility] at [phone] with questions or concerns regarding your invoice.  ° °IF you received labwork today, you will receive an invoice from [facility]. Please contact [facility] at [phone] with questions or concerns regarding your invoice.  ° °Our billing staff will not be able to assist you with questions regarding bills from these companies. ° °You will be contacted with the lab results as soon as they are available. The fastest way to get your results is to activate your My Chart account. Instructions are located on the last page of this paperwork. If you have not heard from us regarding the results in 2 weeks, please contact this office. °  ° ° ° °

## 2018-11-25 ENCOUNTER — Telehealth: Payer: Self-pay | Admitting: Registered Nurse

## 2018-11-25 ENCOUNTER — Other Ambulatory Visit: Payer: Self-pay | Admitting: Registered Nurse

## 2018-11-25 DIAGNOSIS — F988 Other specified behavioral and emotional disorders with onset usually occurring in childhood and adolescence: Secondary | ICD-10-CM

## 2018-11-25 LAB — COMPREHENSIVE METABOLIC PANEL
ALT: 11 IU/L (ref 0–32)
AST: 16 IU/L (ref 0–40)
Albumin/Globulin Ratio: 2.4 — ABNORMAL HIGH (ref 1.2–2.2)
Albumin: 4.8 g/dL (ref 3.9–5.0)
Alkaline Phosphatase: 60 IU/L (ref 39–117)
BUN/Creatinine Ratio: 10 (ref 9–23)
BUN: 7 mg/dL (ref 6–20)
Bilirubin Total: 0.7 mg/dL (ref 0.0–1.2)
CO2: 21 mmol/L (ref 20–29)
Calcium: 9.5 mg/dL (ref 8.7–10.2)
Chloride: 103 mmol/L (ref 96–106)
Creatinine, Ser: 0.67 mg/dL (ref 0.57–1.00)
GFR calc Af Amer: 137 mL/min/{1.73_m2} (ref >59)
GFR calc non Af Amer: 119 mL/min/{1.73_m2} (ref >59)
Globulin, Total: 2 g/dL (ref 1.5–4.5)
Glucose: 89 mg/dL (ref 65–99)
Potassium: 3.8 mmol/L (ref 3.5–5.2)
Sodium: 140 mmol/L (ref 134–144)
Total Protein: 6.8 g/dL (ref 6.0–8.5)

## 2018-11-25 LAB — CBC
Hematocrit: 44.7 % (ref 34.0–46.6)
Hemoglobin: 14.3 g/dL (ref 11.1–15.9)
MCH: 28.4 pg (ref 26.6–33.0)
MCHC: 32 g/dL (ref 31.5–35.7)
MCV: 89 fL (ref 79–97)
Platelets: 191 10*3/uL (ref 150–450)
RBC: 5.03 x10E6/uL (ref 3.77–5.28)
RDW: 13 % (ref 11.7–15.4)
WBC: 6.1 10*3/uL (ref 3.4–10.8)

## 2018-11-25 LAB — LIPID PANEL
Chol/HDL Ratio: 3 ratio (ref 0.0–4.4)
Cholesterol, Total: 146 mg/dL (ref 100–199)
HDL: 48 mg/dL (ref >39)
LDL Calculated: 89 mg/dL (ref 0–99)
Triglycerides: 46 mg/dL (ref 0–149)
VLDL Cholesterol Cal: 9 mg/dL (ref 5–40)

## 2018-11-25 LAB — HEMOGLOBIN A1C
Est. average glucose Bld gHb Est-mCnc: 103 mg/dL
Hgb A1c MFr Bld: 5.2 % (ref 4.8–5.6)

## 2018-11-25 LAB — TSH: TSH: 0.618 u[IU]/mL (ref 0.450–4.500)

## 2018-11-25 MED ORDER — AMPHETAMINE-DEXTROAMPHETAMINE 10 MG PO TABS: 10.0000 mg | ORAL_TABLET | Freq: Two times a day (BID) | ORAL | 0 refills | Status: DC

## 2018-11-25 NOTE — Telephone Encounter (Signed)
Copied from [place] [id number]. Topic: General - Other >> Nov 25, 2018 10:09 AM Yvette Rack wrote: Reason for CRM: Pt stated her insurance will not cover Rx for lisdexamfetamine but she was told they would cover Vyvanse or a generic medication. Pt requests call back.

## 2018-11-25 NOTE — Telephone Encounter (Signed)
Copied from CRM #[id number]. Topic: General - Other °>> Nov 25, 2018 10:09 AM Mcneil, Ja-Kwan wrote: °Reason for CRM: Pt stated her insurance will not cover Rx for lisdexamfetamine but she was told they would cover Vyvanse or a generic medication. Pt requests call back. °

## 2018-11-25 NOTE — Progress Notes (Signed)
Good afternoon Ms Cockerham, Your lab results have returned. They are looking excellent. The only "abnormality" is a mild elevation in your albumin/globulin ratio, caused by some dehydration. It's summertime in the [place], that's reasonable! Otherwise, normal blood counts, your liver and kidneys seem to be doing well, your cholesterol is normal, your thyroid is normal, and you are not diabetic.  If you have any questions, comments, or concerns, please feel free to contact me.  Thank you, Kathrin Ruddy, NP

## 2018-11-25 NOTE — Progress Notes (Signed)
Pt ins would not cover Vyvanse generic, will send Adderall generic instead.  Kathrin Ruddy, NP

## 2018-11-28 NOTE — Telephone Encounter (Signed)
Please advise 

## 2018-11-29 NOTE — Telephone Encounter (Signed)
Called patient to discuss starting generic Adderall for ADD. Pt is taking 10mg  in morning upon waking and 10mg  at lunch States good effect, no concerns at this time Hopes to continue this dosage  Kathrin Ruddy, NP

## 2018-12-17 ENCOUNTER — Encounter: Payer: Self-pay | Admitting: Registered Nurse

## 2018-12-21 ENCOUNTER — Other Ambulatory Visit: Payer: Self-pay | Admitting: Registered Nurse

## 2018-12-21 DIAGNOSIS — F988 Other specified behavioral and emotional disorders with onset usually occurring in childhood and adolescence: Secondary | ICD-10-CM

## 2018-12-21 MED ORDER — AMPHETAMINE-DEXTROAMPHETAMINE 20 MG PO TABS: 20.0000 mg | ORAL_TABLET | Freq: Every day | ORAL | 0 refills | Status: DC

## 2018-12-21 MED ORDER — AMPHETAMINE-DEXTROAMPHETAMINE 10 MG PO TABS: 10.0000 mg | ORAL_TABLET | Freq: Every day | ORAL | 0 refills | Status: DC

## 2018-12-28 ENCOUNTER — Ambulatory Visit (INDEPENDENT_AMBULATORY_CARE_PROVIDER_SITE_OTHER): Payer: No Typology Code available for payment source | Admitting: Registered Nurse

## 2018-12-28 ENCOUNTER — Encounter: Payer: Self-pay | Admitting: Registered Nurse

## 2018-12-28 ENCOUNTER — Other Ambulatory Visit (HOSPITAL_COMMUNITY)
Admission: RE | Admit: 2018-12-28 | Discharge: 2018-12-28 | Disposition: A | Discharge status: Home / Self Care | Payer: PRIVATE HEALTH INSURANCE | Source: Ambulatory Visit | Attending: Registered Nurse | Admitting: Registered Nurse

## 2018-12-28 ENCOUNTER — Other Ambulatory Visit: Payer: Self-pay

## 2018-12-28 VITALS — BP 108/68 | HR 106 | Temp 98.6°F | Resp 16 | Ht 66.14 in | Wt 153.0 lb

## 2018-12-28 DIAGNOSIS — Z0001 Encounter for general adult medical examination with abnormal findings: Secondary | ICD-10-CM

## 2018-12-28 DIAGNOSIS — Z3042 Encounter for surveillance of injectable contraceptive: Secondary | ICD-10-CM

## 2018-12-28 DIAGNOSIS — F988 Other specified behavioral and emotional disorders with onset usually occurring in childhood and adolescence: Secondary | ICD-10-CM

## 2018-12-28 DIAGNOSIS — Z01419 Encounter for gynecological examination (general) (routine) without abnormal findings: Secondary | ICD-10-CM | POA: Insufficient documentation

## 2018-12-28 DIAGNOSIS — Z0000 Encounter for general adult medical examination without abnormal findings: Secondary | ICD-10-CM

## 2018-12-28 DIAGNOSIS — Z01411 Encounter for gynecological examination (general) (routine) with abnormal findings: Secondary | ICD-10-CM | POA: Diagnosis not present

## 2018-12-28 MED ORDER — AMPHETAMINE-DEXTROAMPHETAMINE 20 MG PO TABS: 20.0000 mg | ORAL_TABLET | Freq: Every day | ORAL | 0 refills | Status: DC

## 2018-12-28 MED ORDER — AMPHETAMINE-DEXTROAMPHETAMINE 10 MG PO TABS: 10.0000 mg | ORAL_TABLET | Freq: Every day | ORAL | 0 refills | Status: DC

## 2018-12-28 MED ORDER — MEDROXYPROGESTERONE ACETATE 150 MG/ML IM SUSP
150.0000 mg | Freq: Once | INTRAMUSCULAR | Status: AC
  Administered 2018-12-28: 150 mg via INTRAMUSCULAR

## 2018-12-28 NOTE — Progress Notes (Signed)
Established Patient Office Visit  Subjective:  Patient ID: Meghan Nolan, female    DOB: 04-Feb-1991  Age: 28 y.o. MRN: [id number]  CC:  Chief Complaint  Patient presents with  . Annual Exam    with pap    HPI Meghan Nolan presents for CPE with pap  Recently was seen for TOC. As such, chart is up to date.  Reports history of abnormal pap and HPV+. [facility]  Uses depo provera injection for BCM. Is due today, will be administered. Next due Dec 1 - Dec 15   Past Medical History:  Diagnosis Date  . ADHD (attention deficit hyperactivity disorder)   . Asthma   . Depression     Past Surgical History:  Procedure Laterality Date  . NO PAST SURGERIES      Family History  Problem Relation Age of Onset  . Diabetes Mother   . Breast cancer Mother   . Colon polyps Mother   . Heart disease Father   . Bleeding Disorder Father   . Diabetes Father   . Neurofibromatosis Brother   . Learning disabilities Brother     Social History   Socioeconomic History  . Marital status: Single    Spouse name: Not on file  . Number of children: 1  . Years of education: Not on file  . Highest education level: Not on file  Occupational History  . Not on file  Social Needs  . Financial resource strain: Not hard at all  . Food insecurity    Worry: Never true    Inability: Never true  . Transportation needs    Medical: No    Non-medical: No  Tobacco Use  . Smoking status: Former Smoker    Quit date: 07/16/2015    Years since quitting: 3.4  . Smokeless tobacco: Never Used  Substance and Sexual Activity  . Alcohol use: No    Alcohol/week: 0.0 standard drinks  . Drug use: No  . Sexual activity: Not Currently  Lifestyle  . Physical activity    Days per week: 5 days    Minutes per session: 30 min  . Stress: To some extent  Relationships  . Social Herbalist on phone: Three times a week    Gets together: Twice a week    Attends  religious service: Patient refused    Active member of club or organization: Patient refused    Attends meetings of clubs or organizations: Patient refused    Relationship status: Patient refused  . Intimate partner violence    Fear of current or ex partner: No    Emotionally abused: No    Physically abused: No    Forced sexual activity: No  Other Topics Concern  . Not on file  Social History Narrative  . Not on file    Outpatient Medications Prior to Visit  Medication Sig Dispense Refill  . amphetamine-dextroamphetamine (ADDERALL) 10 MG tablet Take 1 tablet (10 mg total) by mouth daily with lunch. 30 tablet 0  . amphetamine-dextroamphetamine (ADDERALL) 20 MG tablet Take 1 tablet (20 mg total) by mouth daily. 60 tablet 0  . medroxyPROGESTERone Acetate (DEPO-PROVERA) 150 MG/ML SUSY Depo-Provera 150 mg/mL intramuscular syringe  Inject 1 mL every 3 months by intramuscular route for 90 days.     No facility-administered medications prior to visit.     Allergies  Allergen Reactions  . Penicillins Shortness Of Breath  Has patient had a PCN reaction causing immediate rash, facial/tongue/throat swelling, SOB or lightheadedness with hypotension: Yes Has patient had a PCN reaction causing severe rash involving mucus membranes or skin necrosis: Yes Has patient had a PCN reaction that required hospitalization Yes Has patient had a PCN reaction occurring within the last 10 years: No If all of the above answers are "NO", then may proceed with Cephalosporin use.     ROS Review of Systems  Constitutional: Negative.   HENT: Negative.   Respiratory: Negative.   Cardiovascular: Negative.   Gastrointestinal: Negative.   Endocrine: Negative.   Genitourinary: Negative.   Musculoskeletal: Negative.   Skin: Negative.   Allergic/Immunologic: Negative.   Neurological: Negative.   Hematological: Negative.   Psychiatric/Behavioral: Negative.   All other systems reviewed and are negative.      Objective:    Physical Exam  Constitutional: She is oriented to person, place, and time. Vital signs are normal. She appears well-developed and well-nourished.  Non-toxic appearance. She does not have a sickly appearance. She does not appear ill. No distress.  HENT:  Head: Normocephalic and atraumatic.  Right Ear: External ear normal.  Left Ear: External ear normal.  Nose: Nose normal.  Mouth/Throat: Oropharynx is clear and moist. No oropharyngeal exudate.  Eyes: Pupils are equal, round, and reactive to light. Conjunctivae and EOM are normal. Right eye exhibits no discharge. Left eye exhibits no discharge. No scleral icterus.  Fundoscopic exam:      The right eye shows no arteriolar narrowing, no AV nicking, no exudate, no hemorrhage and no papilledema.       The left eye shows no arteriolar narrowing, no AV nicking, no exudate, no hemorrhage and no papilledema.  Neck: Normal range of motion. No tracheal deviation present. No thyromegaly present.  Cardiovascular: Normal rate, regular rhythm, normal heart sounds and intact distal pulses. Exam reveals no gallop and no friction rub.  No murmur heard. Pulmonary/Chest: Effort normal and breath sounds normal. No respiratory distress. She has no wheezes. She has no rales. She exhibits no tenderness.  Abdominal: Soft. Bowel sounds are normal. She exhibits no distension and no mass. There is no abdominal tenderness. There is no rebound and no guarding.  Genitourinary:    Vagina and uterus normal.  Uterus is not deviated, not enlarged, not fixed and not tender. Cervix exhibits no motion tenderness, no discharge and no friability.    No vaginal discharge, erythema or bleeding.  No erythema or bleeding in the vagina.  Musculoskeletal: Normal range of motion.        General: No tenderness, deformity or edema.  Lymphadenopathy:    She has no cervical adenopathy.  Neurological: She is alert and oriented to person, place, and time. No cranial nerve  deficit. She exhibits normal muscle tone. Coordination normal.  Skin: Skin is warm and dry. No rash noted. She is not diaphoretic. No erythema. No pallor.  Psychiatric: She has a normal mood and affect. Her speech is normal and behavior is normal. Judgment and thought content normal. Her mood appears not anxious. Her affect is not angry, not blunt, not labile and not inappropriate. Her speech is not rapid and/or pressured, not delayed, not tangential and not slurred. Cognition and memory are normal. She does not exhibit a depressed mood. She is communicative.  Nursing note and vitals reviewed.   BP 108/68   Pulse (!) 106   Temp 98.6 F (37 C) (Oral)   Resp 16   Ht 5' 6.14" (1.68  m)   Wt 153 lb (69.4 kg)   SpO2 100%   BMI 24.59 kg/m  Wt Readings from Last 3 Encounters:  12/28/18 153 lb (69.4 kg)  11/24/18 159 lb (72.1 kg)  11/20/15 192 lb (87.1 kg)     Health Maintenance Due  Topic Date Due  . PAP-Cervical Cytology Screening  09/10/2010  . PAP SMEAR-Modifier  09/10/2010    There are no preventive care reminders to display for this patient.  Lab Results  Component Value Date   TSH 0.618 11/24/2018   Lab Results  Component Value Date   WBC 6.1 11/24/2018   HGB 14.3 11/24/2018   HCT 44.7 11/24/2018   MCV 89 11/24/2018   PLT 191 11/24/2018   Lab Results  Component Value Date   NA 140 11/24/2018   K 3.8 11/24/2018   CO2 21 11/24/2018   GLUCOSE 89 11/24/2018   BUN 7 11/24/2018   CREATININE 0.67 11/24/2018   BILITOT 0.7 11/24/2018   ALKPHOS 60 11/24/2018   AST 16 11/24/2018   ALT 11 11/24/2018   PROT 6.8 11/24/2018   ALBUMIN 4.8 11/24/2018   CALCIUM 9.5 11/24/2018   Lab Results  Component Value Date   CHOL 146 11/24/2018   Lab Results  Component Value Date   HDL 48 11/24/2018   Lab Results  Component Value Date   LDLCALC 89 11/24/2018   Lab Results  Component Value Date   TRIG 46 11/24/2018   Lab Results  Component Value Date   CHOLHDL 3.0  11/24/2018   Lab Results  Component Value Date   HGBA1C 5.2 11/24/2018      Assessment & Plan:   Problem List Items Addressed This Visit      Other   ADD (attention deficit disorder)    Other Visit Diagnoses    Encounter for annual routine gynecological examination    -  Primary   Relevant Orders   Cytology - PAP([place])   Encounter for management and injection of depo-Provera       Relevant Medications   medroxyPROGESTERone (DEPO-PROVERA) injection 150 mg (Completed)   Routine general medical examination at a health care facility          Meds ordered this encounter  Medications  . medroxyPROGESTERone (DEPO-PROVERA) injection 150 mg    Follow-up: Return in 3 months (on 03/29/2019).   PLAN  Physical exam with normal findings. Next CPE in 1 year.  Pap performed, will follow up when results are available  Return in 3 mos for med check, Depo provera injection.  Patient encouraged to call clinic with any questions, comments, or concerns.   Janeece Agee, NP

## 2018-12-28 NOTE — Patient Instructions (Addendum)
   If you have lab work done today you will be contacted with your lab results within the next 2 weeks.  If you have not heard from us then please contact us. The fastest way to get your results is to register for My Chart.   IF you received an x-ray today, you will receive an invoice from [facility]. Please contact [facility] at [phone] with questions or concerns regarding your invoice.   IF you received labwork today, you will receive an invoice from [facility]. Please contact [facility] at [phone] with questions or concerns regarding your invoice.   Our billing staff will not be able to assist you with questions regarding bills from these companies.  You will be contacted with the lab results as soon as they are available. The fastest way to get your results is to activate your My Chart account. Instructions are located on the last page of this paperwork. If you have not heard from us regarding the results in 2 weeks, please contact this office.       Health Maintenance, Female Adopting a healthy lifestyle and getting preventive care are important in promoting health and wellness. Ask your health care provider about:  The right schedule for you to have regular tests and exams.  Things you can do on your own to prevent diseases and keep yourself healthy. What should I know about diet, weight, and exercise? Eat a healthy diet   Eat a diet that includes plenty of vegetables, fruits, low-fat dairy products, and lean protein.  Do not eat a lot of foods that are high in solid fats, added sugars, or sodium. Maintain a healthy weight Body mass index (BMI) is used to identify weight problems. It estimates body fat based on height and weight. Your health care provider can help determine your BMI and help you achieve or maintain a healthy weight. Get regular exercise Get regular exercise. This is one of the most important things you can do for your health. Most  adults should:  Exercise for at least 150 minutes each week. The exercise should increase your heart rate and make you sweat (moderate-intensity exercise).  Do strengthening exercises at least twice a week. This is in addition to the moderate-intensity exercise.  Spend less time sitting. Even light physical activity can be beneficial. Watch cholesterol and blood lipids Have your blood tested for lipids and cholesterol at 29 years of age, then have this test every 5 years. Have your cholesterol levels checked more often if:  Your lipid or cholesterol levels are high.  You are older than 29 years of age.  You are at high risk for heart disease. What should I know about cancer screening? Depending on your health history and family history, you may need to have cancer screening at various ages. This may include screening for:  Breast cancer.  Cervical cancer.  Colorectal cancer.  Skin cancer.  Lung cancer. What should I know about heart disease, diabetes, and high blood pressure? Blood pressure and heart disease  High blood pressure causes heart disease and increases the risk of stroke. This is more likely to develop in people who have high blood pressure readings, are of African descent, or are overweight.  Have your blood pressure checked: ? Every 3-5 years if you are 18-39 years of age. ? Every year if you are 40 years old or older. Diabetes Have regular diabetes screenings. This checks your fasting blood sugar level. Have the screening done:  Once   every three years after age 40 if you are at a normal weight and have a low risk for diabetes.  More often and at a younger age if you are overweight or have a high risk for diabetes. What should I know about preventing infection? Hepatitis B If you have a higher risk for hepatitis B, you should be screened for this virus. Talk with your health care provider to find out if you are at risk for hepatitis B infection. Hepatitis  C Testing is recommended for:  Everyone born from 1945 through 1965.  Anyone with known risk factors for hepatitis C. Sexually transmitted infections (STIs)  Get screened for STIs, including gonorrhea and chlamydia, if: ? You are sexually active and are younger than 29 years of age. ? You are older than 29 years of age and your health care provider tells you that you are at risk for this type of infection. ? Your sexual activity has changed since you were last screened, and you are at increased risk for chlamydia or gonorrhea. Ask your health care provider if you are at risk.  Ask your health care provider about whether you are at high risk for HIV. Your health care provider may recommend a prescription medicine to help prevent HIV infection. If you choose to take medicine to prevent HIV, you should first get tested for HIV. You should then be tested every 3 months for as long as you are taking the medicine. Pregnancy  If you are about to stop having your period (premenopausal) and you may become pregnant, seek counseling before you get pregnant.  Take 400 to 800 micrograms (mcg) of folic acid every day if you become pregnant.  Ask for birth control (contraception) if you want to prevent pregnancy. Osteoporosis and menopause Osteoporosis is a disease in which the bones lose minerals and strength with aging. This can result in bone fractures. If you are 65 years old or older, or if you are at risk for osteoporosis and fractures, ask your health care provider if you should:  Be screened for bone loss.  Take a calcium or vitamin D supplement to lower your risk of fractures.  Be given hormone replacement therapy (HRT) to treat symptoms of menopause. Follow these instructions at home: Lifestyle  Do not use any products that contain nicotine or tobacco, such as cigarettes, e-cigarettes, and chewing tobacco. If you need help quitting, ask your health care provider.  Do not use street  drugs.  Do not share needles.  Ask your health care provider for help if you need support or information about quitting drugs. Alcohol use  Do not drink alcohol if: ? Your health care provider tells you not to drink. ? You are pregnant, may be pregnant, or are planning to become pregnant.  If you drink alcohol: ? Limit how much you use to 0-1 drink a day. ? Limit intake if you are breastfeeding.  Be aware of how much alcohol is in your drink. In the [place] one drink equals one 12 oz bottle of beer (355 mL), one 5 oz glass of wine (148 mL), or one 1 oz glass of hard liquor (44 mL). General instructions  Schedule regular health, dental, and eye exams.  Stay current with your vaccines.  Tell your health care provider if: ? You often feel depressed. ? You have ever been abused or do not feel safe at home. Summary  Adopting a healthy lifestyle and getting preventive care are important in promoting health and   wellness.  Follow your health care provider's instructions about healthy diet, exercising, and getting tested or screened for diseases.  Follow your health care provider's instructions on monitoring your cholesterol and blood pressure. This information is not intended to replace advice given to you by your health care provider. Make sure you discuss any questions you have with your health care provider. Document Released: 10/14/2010 Document Revised: 03/24/2018 Document Reviewed: 03/24/2018 Elsevier Patient Education  2020 [employer]     Why follow it? Research shows. . Those who follow the Mediterranean diet have a reduced risk of heart disease  . The diet is associated with a reduced incidence of Parkinson's and Alzheimer's diseases . People following the diet may have longer life expectancies and lower rates of chronic diseases  . The Dietary Guidelines for Americans recommends the Mediterranean diet as an eating plan to promote health and prevent disease  What Is the  Mediterranean Diet?  . Healthy eating plan based on typical foods and recipes of Mediterranean-style cooking . The diet is primarily a plant based diet; these foods should make up a majority of meals   Starches - Plant based foods should make up a majority of meals - They are an important sources of vitamins, minerals, energy, antioxidants, and fiber - Choose whole grains, foods high in fiber and minimally processed items  - Typical grain sources include wheat, oats, barley, corn, brown rice, bulgar, farro, millet, polenta, couscous  - Various types of beans include chickpeas, lentils, fava beans, black beans, white beans   Fruits  Veggies - Large quantities of antioxidant rich fruits & veggies; 6 or more servings  - Vegetables can be eaten raw or lightly drizzled with oil and cooked  - Vegetables common to the traditional Mediterranean Diet include: artichokes, arugula, beets, broccoli, brussel sprouts, cabbage, carrots, celery, collard greens, cucumbers, eggplant, kale, leeks, lemons, lettuce, mushrooms, okra, onions, peas, peppers, potatoes, pumpkin, radishes, rutabaga, shallots, spinach, sweet potatoes, turnips, zucchini - Fruits common to the Mediterranean Diet include: apples, apricots, avocados, cherries, clementines, dates, figs, grapefruits, grapes, melons, nectarines, oranges, peaches, pears, pomegranates, strawberries, tangerines  Fats - Replace butter and margarine with healthy oils, such as olive oil, canola oil, and tahini  - Limit nuts to no more than a handful a day  - Nuts include walnuts, almonds, pecans, pistachios, pine nuts  - Limit or avoid candied, honey roasted or heavily salted nuts - Olives are central to the Mediterranean diet - can be eaten whole or used in a variety of dishes   Meats Protein - Limiting red meat: no more than a few times a month - When eating red meat: choose lean cuts and keep the portion to the size of deck of cards - Eggs: approx. 0 to 4 times a  week  - Fish and lean poultry: at least 2 a week  - Healthy protein sources include, chicken, turkey, lean beef, lamb - Increase intake of seafood such as tuna, salmon, trout, mackerel, shrimp, scallops - Avoid or limit high fat processed meats such as sausage and bacon  Dairy - Include moderate amounts of low fat dairy products  - Focus on healthy dairy such as fat free yogurt, skim milk, low or reduced fat cheese - Limit dairy products higher in fat such as whole or 2% milk, cheese, ice cream  Alcohol - Moderate amounts of red wine is ok  - No more than 5 oz daily for women (all ages) and men older than age 65  -   No more than 10 oz of wine daily for men younger than 65  Other - Limit sweets and other desserts  - Use herbs and spices instead of salt to flavor foods  - Herbs and spices common to the traditional Mediterranean Diet include: basil, bay leaves, chives, cloves, cumin, fennel, garlic, lavender, marjoram, mint, oregano, parsley, pepper, rosemary, sage, savory, sumac, tarragon, thyme   It's not just a diet, it's a lifestyle:  . The Mediterranean diet includes lifestyle factors typical of those in the region  . Foods, drinks and meals are best eaten with others and savored . Daily physical activity is important for overall good health . This could be strenuous exercise like running and aerobics . This could also be more leisurely activities such as walking, housework, yard-work, or taking the stairs . Moderation is the key; a balanced and healthy diet accommodates most foods and drinks . Consider portion sizes and frequency of consumption of certain foods   Meal Ideas & Options:  . Breakfast:  o Whole wheat toast or whole wheat English muffins with peanut butter & hard boiled egg o Steel cut oats topped with apples & cinnamon and skim milk  o Fresh fruit: banana, strawberries, melon, berries, peaches  o Smoothies: strawberries, bananas, greek yogurt, peanut butter o Low fat  greek yogurt with blueberries and granola  o Egg white omelet with spinach and mushrooms o Breakfast couscous: whole wheat couscous, apricots, skim milk, cranberries  . Sandwiches:  o Hummus and grilled vegetables (peppers, zucchini, squash) on whole wheat bread   o Grilled chicken on whole wheat pita with lettuce, tomatoes, cucumbers or tzatziki  o Tuna salad on whole wheat bread: tuna salad made with greek yogurt, olives, red peppers, capers, green onions o Garlic rosemary lamb pita: lamb sauted with garlic, rosemary, salt & pepper; add lettuce, cucumber, greek yogurt to pita - flavor with lemon juice and black pepper  . Seafood:  o Mediterranean grilled salmon, seasoned with garlic, basil, parsley, lemon juice and black pepper o Shrimp, lemon, and spinach whole-grain pasta salad made with low fat greek yogurt  o Seared scallops with lemon orzo  o Seared tuna steaks seasoned salt, pepper, coriander topped with tomato mixture of olives, tomatoes, olive oil, minced garlic, parsley, green onions and cappers  . Meats:  o Herbed greek chicken salad with kalamata olives, cucumber, feta  o Red bell peppers stuffed with spinach, bulgur, lean ground beef (or lentils) & topped with feta   o Kebabs: skewers of chicken, tomatoes, onions, zucchini, squash  o Turkey burgers: made with red onions, mint, dill, lemon juice, feta cheese topped with roasted red peppers . Vegetarian o Cucumber salad: cucumbers, artichoke hearts, celery, red onion, feta cheese, tossed in olive oil & lemon juice  o Hummus and whole grain pita points with a greek salad (lettuce, tomato, feta, olives, cucumbers, red onion) o Lentil soup with celery, carrots made with vegetable broth, garlic, salt and pepper  o Tabouli salad: parsley, bulgur, mint, scallions, cucumbers, tomato, radishes, lemon juice, olive oil, salt and pepper.       Fat and Cholesterol Restricted Eating Plan Eating a diet that limits fat and cholesterol may  help lower your risk for heart disease and other conditions. Your body needs fat and cholesterol for basic functions, but eating too much of these things can be harmful to your health. Your health care provider may order lab tests to check your blood fat (lipid) and cholesterol levels. This helps your   health care provider understand your risk for certain conditions and whether you need to make diet changes. Work with your health care provider or dietitian to make an eating plan that is right for you. Your plan includes:  Limit your fat intake to ______% or less of your total calories a day.  Limit your saturated fat intake to ______% or less of your total calories a day.  Limit the amount of cholesterol in your diet to less than _________mg a day.  Eat ___________ g of fiber a day. What are tips for following this plan? General guidelines   If you are overweight, work with your health care provider to lose weight safely. Losing just 5-10% of your body weight can improve your overall health and help prevent diseases such as diabetes and heart disease.  Avoid: ? Foods with added sugar. ? Fried foods. ? Foods that contain partially hydrogenated oils, including stick margarine, some tub margarines, cookies, crackers, and other baked goods.  Limit alcohol intake to no more than 1 drink a day for nonpregnant women and 2 drinks a day for men. One drink equals 12 oz of beer, 5 oz of wine, or 1 oz of hard liquor. Reading food labels  Check food labels for: ? Trans fats, partially hydrogenated oils, or high amounts of saturated fat. Avoid foods that contain saturated fat and trans fat. ? The amount of cholesterol in each serving. Try to eat no more than 200 mg of cholesterol each day. ? The amount of fiber in each serving. Try to eat at least 20-30 g of fiber each day.  Choose foods with healthy fats, such as: ? Monounsaturated and polyunsaturated fats. These include olive and canola oil,  flaxseeds, walnuts, almonds, and seeds. ? Omega-3 fats. These are found in foods such as salmon, mackerel, sardines, tuna, flaxseed oil, and ground flaxseeds.  Choose grain products that have whole grains. Look for the word "whole" as the first word in the ingredient list. Cooking  Cook foods using methods other than frying. Baking, boiling, grilling, and broiling are some healthy options.  Eat more home-cooked food and less restaurant, buffet, and fast food.  Avoid cooking using saturated fats. ? Animal sources of saturated fats include meats, butter, and cream. ? Plant sources of saturated fats include palm oil, palm kernel oil, and coconut oil. Meal planning   At meals, imagine dividing your plate into fourths: ? Fill one-half of your plate with vegetables and green salads. ? Fill one-fourth of your plate with whole grains. ? Fill one-fourth of your plate with lean protein foods.  Eat fish that is high in omega-3 fats at least two times a week.  Eat more foods that contain fiber, such as whole grains, beans, apples, broccoli, carrots, peas, and barley. These foods help promote healthy cholesterol levels in the blood. Recommended foods Grains  Whole grains, such as whole wheat or whole grain breads, crackers, cereals, and pasta. Unsweetened oatmeal, bulgur, barley, quinoa, or brown rice. Corn or whole wheat flour tortillas. Vegetables  Fresh or frozen vegetables (raw, steamed, roasted, or grilled). Green salads. Fruits  All fresh, canned (in natural juice), or frozen fruits. Meats and other protein foods  Ground beef (85% or leaner), grass-fed beef, or beef trimmed of fat. Skinless chicken or turkey. Ground chicken or turkey. Pork trimmed of fat. All fish and seafood. Egg whites. Dried beans, peas, or lentils. Unsalted nuts or seeds. Unsalted canned beans. Natural nut butters without added sugar and oil. Dairy    Low-fat or nonfat dairy products, such as skim or 1% milk, 2% or  reduced-fat cheeses, low-fat and fat-free ricotta or cottage cheese, or plain low-fat and nonfat yogurt. Fats and oils  Tub margarine without trans fats. Light or reduced-fat mayonnaise and salad dressings. Avocado. Olive, canola, sesame, or safflower oils. The items listed above may not be a complete list of recommended foods or beverages. Contact your dietitian for more options. Foods to avoid Grains  White bread. White pasta. White rice. Cornbread. Bagels, pastries, and croissants. Crackers and snack foods that contain trans fat and hydrogenated oils. Vegetables  Vegetables cooked in cheese, cream, or butter sauce. Fried vegetables. Fruits  Canned fruit in heavy syrup. Fruit in cream or butter sauce. Fried fruit. Meats and other protein foods  Fatty cuts of meat. Ribs, chicken wings, bacon, sausage, bologna, salami, chitterlings, fatback, hot dogs, bratwurst, and packaged lunch meats. Liver and organ meats. Whole eggs and egg yolks. Chicken and turkey with skin. Fried meat. Dairy  Whole or 2% milk, cream, half-and-half, and cream cheese. Whole milk cheeses. Whole-fat or sweetened yogurt. Full-fat cheeses. Nondairy creamers and whipped toppings. Processed cheese, cheese spreads, and cheese curds. Beverages  Alcohol. Sugar-sweetened drinks such as sodas, lemonade, and fruit drinks. Fats and oils  Butter, stick margarine, lard, shortening, ghee, or bacon fat. Coconut, palm kernel, and palm oils. Sweets and desserts  Corn syrup, sugars, honey, and molasses. Candy. Jam and jelly. Syrup. Sweetened cereals. Cookies, pies, cakes, donuts, muffins, and ice cream. The items listed above may not be a complete list of foods and beverages to avoid. Contact your dietitian for more information. Summary  Your body needs fat and cholesterol for basic functions. However, eating too much of these things can be harmful to your health.  Work with your health care provider and dietitian to follow a  diet low in fat and cholesterol. Doing this may help lower your risk for heart disease and other conditions.  Choose healthy fats, such as monounsaturated and polyunsaturated fats, and foods high in omega-3 fatty acids.  Eat fiber-rich foods, such as whole grains, beans, peas, fruits, and vegetables.  Limit or avoid alcohol, fried foods, and foods high in saturated fats, partially hydrogenated oils, and sugar. This information is not intended to replace advice given to you by your health care provider. Make sure you discuss any questions you have with your health care provider. Document Released: 03/31/2005 Document Revised: 03/13/2017 Document Reviewed: 12/16/2016 Elsevier Patient Education  2020 [employer] Inc.  American Heart Association (AHA) Exercise Recommendation  Being physically active is important to prevent heart disease and stroke, the nation's No. 1and No. 5killers. To improve overall cardiovascular health, we suggest at least 150 minutes per week of moderate exercise or 75 minutes per week of vigorous exercise (or a combination of moderate and vigorous activity). Thirty minutes a day, five times a week is an easy goal to remember. You will also experience benefits even if you divide your time into two or three segments of 10 to 15 minutes per day.  For people who would benefit from lowering their blood pressure or cholesterol, we recommend 40 minutes of aerobic exercise of moderate to vigorous intensity three to four times a week to lower the risk for heart attack and stroke.  Physical activity is anything that makes you move your body and burn calories.  This includes things like climbing stairs or playing sports. Aerobic exercises benefit your heart, and include walking, jogging, swimming or biking. Strength and   stretching exercises are best for overall stamina and flexibility.  The simplest, positive change you can make to effectively improve your heart health is to start  walking. It's enjoyable, free, easy, social and great exercise. A walking program is flexible and boasts high success rates because people can stick with it. It's easy for walking to become a regular and satisfying part of life.   For Overall Cardiovascular Health:  At least 30 minutes of moderate-intensity aerobic activity at least 5 days per week for a total of 150  OR   At least 25 minutes of vigorous aerobic activity at least 3 days per week for a total of 75 minutes; or a combination of moderate- and vigorous-intensity aerobic activity  AND   Moderate- to high-intensity muscle-strengthening activity at least 2 days per week for additional health benefits.  For Lowering Blood Pressure and Cholesterol  An average 40 minutes of moderate- to vigorous-intensity aerobic activity 3 or 4 times per week  What if I can't make it to the time goal? Something is always better than nothing! And everyone has to start somewhere. Even if you've been sedentary for years, today is the day you can begin to make healthy changes in your life. If you don't think you'll make it for 30 or 40 minutes, set a reachable goal for today. You can work up toward your overall goal by increasing your time as you get stronger. Don't let all-or-nothing thinking rob you of doing what you can every day.  Source:http://www.heart.org    

## 2019-01-02 LAB — CYTOLOGY - PAP
Chlamydia: NEGATIVE
Diagnosis: NEGATIVE
Molecular Disclaimer: NEGATIVE
Molecular Disclaimer: NEGATIVE
Neisseria Gonorrhea: NEGATIVE
Trichomonas: NEGATIVE

## 2019-01-24 ENCOUNTER — Other Ambulatory Visit: Payer: Self-pay | Admitting: Registered Nurse

## 2019-01-24 DIAGNOSIS — F988 Other specified behavioral and emotional disorders with onset usually occurring in childhood and adolescence: Secondary | ICD-10-CM

## 2019-01-26 MED ORDER — AMPHETAMINE-DEXTROAMPHETAMINE 10 MG PO TABS: 10.0000 mg | ORAL_TABLET | Freq: Every day | ORAL | 0 refills | Status: DC

## 2019-02-15 ENCOUNTER — Other Ambulatory Visit: Payer: Self-pay | Admitting: Registered Nurse

## 2019-02-15 DIAGNOSIS — F988 Other specified behavioral and emotional disorders with onset usually occurring in childhood and adolescence: Secondary | ICD-10-CM

## 2019-02-16 MED ORDER — AMPHETAMINE-DEXTROAMPHETAMINE 20 MG PO TABS: 20.0000 mg | ORAL_TABLET | Freq: Every day | ORAL | 0 refills | Status: DC

## 2019-02-28 ENCOUNTER — Other Ambulatory Visit: Payer: Self-pay | Admitting: Registered Nurse

## 2019-02-28 DIAGNOSIS — F988 Other specified behavioral and emotional disorders with onset usually occurring in childhood and adolescence: Secondary | ICD-10-CM

## 2019-03-01 MED ORDER — AMPHETAMINE-DEXTROAMPHETAMINE 10 MG PO TABS: 10.0000 mg | ORAL_TABLET | Freq: Every day | ORAL | 0 refills | Status: DC

## 2019-03-29 ENCOUNTER — Other Ambulatory Visit: Payer: Self-pay

## 2019-03-29 ENCOUNTER — Encounter: Payer: Self-pay | Admitting: Registered Nurse

## 2019-03-29 ENCOUNTER — Ambulatory Visit (INDEPENDENT_AMBULATORY_CARE_PROVIDER_SITE_OTHER): Payer: PRIVATE HEALTH INSURANCE | Admitting: Registered Nurse

## 2019-03-29 VITALS — BP 117/76 | HR 98 | Temp 99.0°F | Resp 12 | Ht 65.5 in | Wt 155.0 lb

## 2019-03-29 DIAGNOSIS — Z3042 Encounter for surveillance of injectable contraceptive: Secondary | ICD-10-CM | POA: Diagnosis not present

## 2019-03-29 DIAGNOSIS — F988 Other specified behavioral and emotional disorders with onset usually occurring in childhood and adolescence: Secondary | ICD-10-CM

## 2019-03-29 MED ORDER — MEDROXYPROGESTERONE ACETATE 150 MG/ML IM SUSP
150.0000 mg | Freq: Once | INTRAMUSCULAR | Status: AC
  Administered 2019-03-29: 150 mg via INTRAMUSCULAR

## 2019-03-29 MED ORDER — AMPHETAMINE-DEXTROAMPHETAMINE 10 MG PO TABS: 10.0000 mg | ORAL_TABLET | Freq: Every day | ORAL | 0 refills | Status: DC

## 2019-03-29 MED ORDER — AMPHETAMINE-DEXTROAMPHETAMINE 20 MG PO TABS
20.0000 mg | ORAL_TABLET | Freq: Every day | ORAL | 0 refills | Status: DC
Start: 2019-04-08 — End: 2019-04-14

## 2019-03-29 NOTE — Progress Notes (Signed)
Established Patient Office Visit  Subjective:  Patient ID: Meghan Nolan, female    DOB: 09-14-1990  Age: 28 y.o. MRN: [id number]  CC:  Chief Complaint  Patient presents with  . Follow-up    Depo shot    HPI Meghan ElksJamie L Nolan presents for med check and dmpa refill  Has been on dmpa for some time now - aware of risk of low Vit D/bone density. We will monitor this in the future and consider a depo vacation going forward. Otherwise, no complaints. No aub, cramping, or concerns for pregnancy.  Adderall: Not quite due for refill but we will review this today. PDMP consulted. No issues on current dose. 20mg  PO qam and 10mg  after lunch PRN. Happy with current dose. No sleep disturbance, palpitations.  Past Medical History:  Diagnosis Date  . ADHD (attention deficit hyperactivity disorder)   . Asthma   . Depression     Past Surgical History:  Procedure Laterality Date  . NO PAST SURGERIES      Family History  Problem Relation Age of Onset  . Diabetes Mother   . Breast cancer Mother   . Colon polyps Mother   . Heart disease Father   . Bleeding Disorder Father   . Diabetes Father   . Neurofibromatosis Brother   . Learning disabilities Brother     Social History   Socioeconomic History  . Marital status: Single    Spouse name: Not on file  . Number of children: 1  . Years of education: Not on file  . Highest education level: Not on file  Occupational History  . Not on file  Tobacco Use  . Smoking status: Former Smoker    Quit date: 07/16/2015    Years since quitting: 3.7  . Smokeless tobacco: Never Used  Substance and Sexual Activity  . Alcohol use: No    Alcohol/week: 0.0 standard drinks  . Drug use: No  . Sexual activity: Not Currently  Other Topics Concern  . Not on file  Social History Narrative  . Not on file   Social Determinants of Health   Financial Resource Strain: Low Risk   . Difficulty of Paying Living Expenses: Not hard at all  Food Insecurity:  No Food Insecurity  . Worried About Programme researcher, broadcasting/film/videounning Out of Food in the Last Year: Never true  . Ran Out of Food in the Last Year: Never true  Transportation Needs: No Transportation Needs  . Lack of Transportation (Medical): No  . Lack of Transportation (Non-Medical): No  Physical Activity: Sufficiently Active  . Days of Exercise per Week: 5 days  . Minutes of Exercise per Session: 30 min  Stress: Stress Concern Present  . Feeling of Stress : To some extent  Social Connections: Unknown  . Frequency of Communication with Friends and Family: Three times a week  . Frequency of Social Gatherings with Friends and Family: Twice a week  . Attends Religious Services: Patient refused  . Active Member of Clubs or Organizations: Patient refused  . Attends BankerClub or Organization Meetings: Patient refused  . Marital Status: Patient refused  Intimate Partner Violence: Not At Risk  . Fear of Current or Ex-Partner: No  . Emotionally Abused: No  . Physically Abused: No  . Sexually Abused: No    Outpatient Medications Prior to Visit  Medication Sig Dispense Refill  . amphetamine-dextroamphetamine (ADDERALL) 10 MG tablet Take 1 tablet (10 mg total) by mouth daily with lunch. 30 tablet 0  . amphetamine-dextroamphetamine (  ADDERALL) 20 MG tablet Take 1 tablet (20 mg total) by mouth daily. 60 tablet 0  . medroxyPROGESTERone Acetate (DEPO-PROVERA) 150 MG/ML SUSY Depo-Provera 150 mg/mL intramuscular syringe  Inject 1 mL every 3 months by intramuscular route for 90 days.     No facility-administered medications prior to visit.    Allergies  Allergen Reactions  . Penicillins Shortness Of Breath    Has patient had a PCN reaction causing immediate rash, facial/tongue/throat swelling, SOB or lightheadedness with hypotension: Yes Has patient had a PCN reaction causing severe rash involving mucus membranes or skin necrosis: Yes Has patient had a PCN reaction that required hospitalization Yes Has patient had a PCN  reaction occurring within the last 10 years: No If all of the above answers are "NO", then may proceed with Cephalosporin use.     ROS Review of Systems  Constitutional: Negative.   HENT: Negative.   Eyes: Negative.   Respiratory: Negative.   Cardiovascular: Negative.   Gastrointestinal: Negative.   Endocrine: Negative.   Genitourinary: Negative.   Musculoskeletal: Negative.   Skin: Negative.   Allergic/Immunologic: Negative.   Neurological: Negative.   Hematological: Negative.   Psychiatric/Behavioral: Negative.   All other systems reviewed and are negative.     Objective:    Physical Exam  Constitutional: She is oriented to person, place, and time. She appears well-developed and well-nourished.  Cardiovascular: Normal rate and regular rhythm.  Pulmonary/Chest: Effort normal. No respiratory distress.  Neurological: She is alert and oriented to person, place, and time.  Skin: Skin is warm and dry. No rash noted. No erythema. No pallor.  Psychiatric: She has a normal mood and affect. Her behavior is normal. Judgment and thought content normal.  Nursing note and vitals reviewed.   BP 117/76   Pulse 98   Temp 99 F (37.2 C)   Resp 12   Ht 5' 5.5" (1.664 m)   Wt 155 lb (70.3 kg)   SpO2 99%   BMI 25.40 kg/m  Wt Readings from Last 3 Encounters:  03/29/19 155 lb (70.3 kg)  12/28/18 153 lb (69.4 kg)  11/24/18 159 lb (72.1 kg)     There are no preventive care reminders to display for this patient.  There are no preventive care reminders to display for this patient.  Lab Results  Component Value Date   TSH 0.618 11/24/2018   Lab Results  Component Value Date   WBC 6.1 11/24/2018   HGB 14.3 11/24/2018   HCT 44.7 11/24/2018   MCV 89 11/24/2018   PLT 191 11/24/2018   Lab Results  Component Value Date   NA 140 11/24/2018   K 3.8 11/24/2018   CO2 21 11/24/2018   GLUCOSE 89 11/24/2018   BUN 7 11/24/2018   CREATININE 0.67 11/24/2018   BILITOT 0.7  11/24/2018   ALKPHOS 60 11/24/2018   AST 16 11/24/2018   ALT 11 11/24/2018   PROT 6.8 11/24/2018   ALBUMIN 4.8 11/24/2018   CALCIUM 9.5 11/24/2018   Lab Results  Component Value Date   CHOL 146 11/24/2018   Lab Results  Component Value Date   HDL 48 11/24/2018   Lab Results  Component Value Date   LDLCALC 89 11/24/2018   Lab Results  Component Value Date   TRIG 46 11/24/2018   Lab Results  Component Value Date   CHOLHDL 3.0 11/24/2018   Lab Results  Component Value Date   HGBA1C 5.2 11/24/2018      Assessment & Plan:  Problem List Items Addressed This Visit      Other   ADD (attention deficit disorder) - Primary    Other Visit Diagnoses    Encounter for management and injection of depo-Provera          No orders of the defined types were placed in this encounter.   Follow-up: Return in about 3 months (around 06/27/2019), or Due Mar 2-16, 2021 for DMPA and Med check.   PLAN  Refill Depo, next injection due between mar2 and Jun 28 2019  Refill adderall for 3 mo, will have med check at next depo injection  Otherwise patient seems very well. Having success at work and happy with home life  Patient encouraged to call clinic with any questions, comments, or concerns.  Janeece Agee, NP

## 2019-04-14 ENCOUNTER — Other Ambulatory Visit: Payer: Self-pay | Admitting: Registered Nurse

## 2019-04-14 DIAGNOSIS — F988 Other specified behavioral and emotional disorders with onset usually occurring in childhood and adolescence: Secondary | ICD-10-CM

## 2019-04-18 MED ORDER — AMPHETAMINE-DEXTROAMPHETAMINE 20 MG PO TABS: 20.0000 mg | ORAL_TABLET | Freq: Every day | ORAL | 0 refills | Status: DC

## 2019-04-18 MED ORDER — AMPHETAMINE-DEXTROAMPHETAMINE 10 MG PO TABS: 10.0000 mg | ORAL_TABLET | Freq: Every day | ORAL | 0 refills | Status: DC

## 2019-04-18 NOTE — Telephone Encounter (Signed)
Patient is requesting a refill of the following medications: Requested Prescriptions   Pending Prescriptions Disp Refills  . amphetamine-dextroamphetamine (ADDERALL) 10 MG tablet 90 tablet 0    Sig: Take 1 tablet (10 mg total) by mouth daily with lunch.  . amphetamine-dextroamphetamine (ADDERALL) 20 MG tablet 90 tablet 0    Sig: Take 1 tablet (20 mg total) by mouth daily.    Date of patient request: 04/14/2019 Last office visit: 03/29/2019 Date of last refill: 04/08/2019 Last refill amount: 90 ?  Follow up time period per chart: 3 m f/u   Not sure if a # 90 is allowed to be refilled. I have set the script up as #30

## 2019-04-27 ENCOUNTER — Ambulatory Visit: Payer: PRIVATE HEALTH INSURANCE | Attending: Internal Medicine

## 2019-04-27 DIAGNOSIS — Z20822 Contact with and (suspected) exposure to covid-19: Secondary | ICD-10-CM

## 2019-04-28 LAB — NOVEL CORONAVIRUS, NAA: SARS-CoV-2, NAA: NOT DETECTED

## 2019-06-06 ENCOUNTER — Encounter: Payer: Self-pay | Admitting: Registered Nurse

## 2019-06-07 NOTE — Telephone Encounter (Signed)
Called patient to let her know that even if she didn't want to get the birth control she could still come to her follow up appointment as scheduled.

## 2019-06-28 ENCOUNTER — Ambulatory Visit (INDEPENDENT_AMBULATORY_CARE_PROVIDER_SITE_OTHER): Payer: PRIVATE HEALTH INSURANCE | Admitting: Registered Nurse

## 2019-06-28 ENCOUNTER — Encounter: Payer: Self-pay | Admitting: Registered Nurse

## 2019-06-28 ENCOUNTER — Other Ambulatory Visit: Payer: Self-pay

## 2019-06-28 DIAGNOSIS — F988 Other specified behavioral and emotional disorders with onset usually occurring in childhood and adolescence: Secondary | ICD-10-CM | POA: Diagnosis not present

## 2019-06-28 MED ORDER — AMPHETAMINE-DEXTROAMPHETAMINE 20 MG PO TABS: 20.0000 mg | ORAL_TABLET | Freq: Every day | ORAL | 0 refills | Status: DC

## 2019-06-28 MED ORDER — AMPHETAMINE-DEXTROAMPHETAMINE 10 MG PO TABS: 10.0000 mg | ORAL_TABLET | Freq: Every day | ORAL | 0 refills | Status: DC

## 2019-06-28 NOTE — Progress Notes (Signed)
Established Patient Office Visit  Subjective:  Patient ID: Meghan Nolan, female    DOB: 1990-05-27  Age: 29 y.o. MRN: [id number]  CC:  Chief Complaint  Patient presents with  . Follow-up    follow up medication check no other concerns at this moment.    HPI Meghan Nolan presents for med check   ADHD: On adderall 20mg  po qd with breakfast and adderall 10mg  PO qd with lunch. Good effect. Denies AEs. Takes some weekends off, sometimes only takes 10mg  on weekends  On DMPA for bcm. Received yesterday at [facility] - is aware of refill date - will likely get it at [facility] going forward as her insurance covers it more effectively there.  Otherwise no concerns. Feeling well overall.   Past Medical History:  Diagnosis Date  . ADHD (attention deficit hyperactivity disorder)   . Asthma   . Depression     Past Surgical History:  Procedure Laterality Date  . NO PAST SURGERIES      Family History  Problem Relation Age of Onset  . Diabetes Mother   . Breast cancer Mother   . Colon polyps Mother   . Heart disease Father   . Bleeding Disorder Father   . Diabetes Father   . Neurofibromatosis Brother   . Learning disabilities Brother     Social History   Socioeconomic History  . Marital status: Single    Spouse name: Not on file  . Number of children: 1  . Years of education: Not on file  . Highest education level: Not on file  Occupational History  . Not on file  Tobacco Use  . Smoking status: Former Smoker    Quit date: 07/16/2015    Years since quitting: 3.9  . Smokeless tobacco: Never Used  Substance and Sexual Activity  . Alcohol use: No    Alcohol/week: 0.0 standard drinks  . Drug use: No  . Sexual activity: Not Currently  Other Topics Concern  . Not on file  Social History Narrative  . Not on file   Social Determinants of Health   Financial Resource Strain: Low Risk   . Difficulty of Paying Living Expenses: Not hard at all  Food Insecurity: No Food Insecurity    . Worried About in the Last Year: Never true  . Ran Out of Food in the Last Year: Never true  Transportation Needs: No Transportation Needs  . Lack of Transportation (Medical): No  . Lack of Transportation (Non-Medical): No  Physical Activity: Sufficiently Active  . Days of Exercise per Week: 5 days  . Minutes of Exercise per Session: 30 min  Stress: Stress Concern Present  . Feeling of Stress : To some extent  Social Connections: Unknown  . Frequency of Communication with Friends and Family: Three times a week  . Frequency of Social Gatherings with Friends and Family: Twice a week  . Attends Religious Services: Patient refused  . Active Member of Clubs or Organizations: Patient refused  . Attends Meetings: Patient refused  . Marital Status: Patient refused  Intimate Partner Violence: Not At Risk  . Fear of Current or Ex-Partner: No  . Emotionally Abused: No  . Physically Abused: No  . Sexually Abused: No    Outpatient Medications Prior to Visit  Medication Sig Dispense Refill  . medroxyPROGESTERone Acetate (DEPO-PROVERA) 150 MG/ML SUSY Depo-Provera 150 mg/mL intramuscular syringe  Inject 1 mL every 3 months by intramuscular route for 90  days.    . amphetamine-dextroamphetamine (ADDERALL) 10 MG tablet Take 1 tablet (10 mg total) by mouth daily with lunch. 30 tablet 0  . amphetamine-dextroamphetamine (ADDERALL) 20 MG tablet Take 1 tablet (20 mg total) by mouth daily. 30 tablet 0   No facility-administered medications prior to visit.    Allergies  Allergen Reactions  . Penicillins Shortness Of Breath    Has patient had a PCN reaction causing immediate rash, facial/tongue/throat swelling, SOB or lightheadedness with hypotension: Yes Has patient had a PCN reaction causing severe rash involving mucus membranes or skin necrosis: Yes Has patient had a PCN reaction that required hospitalization Yes Has patient had a PCN reaction occurring  within the last 10 years: No If all of the above answers are "NO", then may proceed with Cephalosporin use.     ROS Review of Systems  Constitutional: Negative.   HENT: Negative.   Eyes: Negative.   Respiratory: Negative.   Cardiovascular: Negative.   Gastrointestinal: Negative.   Endocrine: Negative.   Genitourinary: Negative.   Musculoskeletal: Negative.   Skin: Negative.   Allergic/Immunologic: Negative.   Neurological: Negative.   Hematological: Negative.   Psychiatric/Behavioral: Negative.   All other systems reviewed and are negative.     Objective:    Physical Exam  Constitutional: She is oriented to person, place, and time. She appears well-developed and well-nourished. No distress.  Cardiovascular: Normal rate and regular rhythm.  Pulmonary/Chest: Effort normal. No respiratory distress.  Neurological: She is alert and oriented to person, place, and time.  Skin: Skin is warm and dry. No rash noted. She is not diaphoretic. No erythema. No pallor.  Psychiatric: She has a normal mood and affect. Her behavior is normal. Judgment and thought content normal.  Nursing note and vitals reviewed.   BP 113/79   Pulse (!) 105   Temp 97.8 F (36.6 C) (Temporal)   Ht 5' 5.5" (1.664 m)   Wt 157 lb 6.4 oz (71.4 kg)   SpO2 100%   BMI 25.79 kg/m  Wt Readings from Last 3 Encounters:  06/28/19 157 lb 6.4 oz (71.4 kg)  03/29/19 155 lb (70.3 kg)  12/28/18 153 lb (69.4 kg)     There are no preventive care reminders to display for this patient.  There are no preventive care reminders to display for this patient.  Lab Results  Component Value Date   TSH 0.618 11/24/2018   Lab Results  Component Value Date   WBC 6.1 11/24/2018   HGB 14.3 11/24/2018   HCT 44.7 11/24/2018   MCV 89 11/24/2018   PLT 191 11/24/2018   Lab Results  Component Value Date   NA 140 11/24/2018   K 3.8 11/24/2018   CO2 21 11/24/2018   GLUCOSE 89 11/24/2018   BUN 7 11/24/2018   CREATININE  0.67 11/24/2018   BILITOT 0.7 11/24/2018   ALKPHOS 60 11/24/2018   AST 16 11/24/2018   ALT 11 11/24/2018   PROT 6.8 11/24/2018   ALBUMIN 4.8 11/24/2018   CALCIUM 9.5 11/24/2018   Lab Results  Component Value Date   CHOL 146 11/24/2018   Lab Results  Component Value Date   HDL 48 11/24/2018   Lab Results  Component Value Date   LDLCALC 89 11/24/2018   Lab Results  Component Value Date   TRIG 46 11/24/2018   Lab Results  Component Value Date   CHOLHDL 3.0 11/24/2018   Lab Results  Component Value Date   HGBA1C 5.2 11/24/2018  Assessment & Plan:   Problem List Items Addressed This Visit      Other   ADD (attention deficit disorder)   Relevant Medications   amphetamine-dextroamphetamine (ADDERALL) 10 MG tablet   amphetamine-dextroamphetamine (ADDERALL) 20 MG tablet      Meds ordered this encounter  Medications  . amphetamine-dextroamphetamine (ADDERALL) 10 MG tablet    Sig: Take 1 tablet (10 mg total) by mouth daily with lunch.    Dispense:  90 tablet    Refill:  0    Order Specific Question:   Supervising Provider    Answer:   Delia Chimes A [id number]  . amphetamine-dextroamphetamine (ADDERALL) 20 MG tablet    Sig: Take 1 tablet (20 mg total) by mouth daily.    Dispense:  90 tablet    Refill:  0    Order Specific Question:   Supervising Provider    Answer:   Forrest Moron [id number]    Follow-up: No follow-ups on file.   PLAN  Refill adderall x 3 mo  DMPA due in about 3 mo - but she will continue to [facility]  Follow up in 3 mo for med check  Patient encouraged to call clinic with any questions, comments, or concerns.  Maximiano Coss, NP

## 2019-06-28 NOTE — Patient Instructions (Signed)
° ° ° °  If you have lab work done today you will be contacted with your lab results within the next 2 weeks.  If you have not heard from us then please contact us. The fastest way to get your results is to register for My Chart. ° ° °IF you received an x-ray today, you will receive an invoice from [facility]. Please contact [facility] at [phone] with questions or concerns regarding your invoice.  ° °IF you received labwork today, you will receive an invoice from [facility]. Please contact [facility] at [phone] with questions or concerns regarding your invoice.  ° °Our billing staff will not be able to assist you with questions regarding bills from these companies. ° °You will be contacted with the lab results as soon as they are available. The fastest way to get your results is to activate your My Chart account. Instructions are located on the last page of this paperwork. If you have not heard from us regarding the results in 2 weeks, please contact this office. °  ° ° ° °

## 2019-07-20 ENCOUNTER — Other Ambulatory Visit: Payer: Self-pay | Admitting: Registered Nurse

## 2019-07-20 DIAGNOSIS — F988 Other specified behavioral and emotional disorders with onset usually occurring in childhood and adolescence: Secondary | ICD-10-CM

## 2019-07-20 NOTE — Telephone Encounter (Signed)
Called the patient and she stated that she never went to get the prescription and is going to head to the pharmacy, but Providers have to deny this medication. Please Advise

## 2019-07-20 NOTE — Telephone Encounter (Signed)
Patient is requesting a refill of the following medications: Requested Prescriptions   Pending Prescriptions Disp Refills  . amphetamine-dextroamphetamine (ADDERALL) 10 MG tablet 90 tablet 0    Sig: Take 1 tablet (10 mg total) by mouth daily with lunch.  . amphetamine-dextroamphetamine (ADDERALL) 20 MG tablet 90 tablet 0    Sig: Take 1 tablet (20 mg total) by mouth daily.    Date of patient request: 07/20/2019 Last office visit: 06/28/2019 Date of last refill: 06/28/2019 Last refill amount: 9 Follow up time period per chart: 09/28/2019

## 2019-07-24 MED ORDER — AMPHETAMINE-DEXTROAMPHETAMINE 20 MG PO TABS: 20.0000 mg | ORAL_TABLET | Freq: Every day | ORAL | 0 refills | Status: DC

## 2019-07-24 MED ORDER — AMPHETAMINE-DEXTROAMPHETAMINE 10 MG PO TABS: 10.0000 mg | ORAL_TABLET | Freq: Every day | ORAL | 0 refills | Status: DC

## 2019-09-28 ENCOUNTER — Encounter: Payer: Self-pay | Admitting: Registered Nurse

## 2019-09-28 ENCOUNTER — Other Ambulatory Visit: Payer: Self-pay

## 2019-09-28 ENCOUNTER — Ambulatory Visit (INDEPENDENT_AMBULATORY_CARE_PROVIDER_SITE_OTHER): Payer: Self-pay | Admitting: Registered Nurse

## 2019-09-28 DIAGNOSIS — F988 Other specified behavioral and emotional disorders with onset usually occurring in childhood and adolescence: Secondary | ICD-10-CM

## 2019-09-28 MED ORDER — AMPHETAMINE-DEXTROAMPHETAMINE 20 MG PO TABS: 20.0000 mg | ORAL_TABLET | Freq: Every day | ORAL | 0 refills | Status: DC

## 2019-09-28 MED ORDER — AMPHETAMINE-DEXTROAMPHETAMINE 10 MG PO TABS: 10.0000 mg | ORAL_TABLET | Freq: Every day | ORAL | 0 refills | Status: DC

## 2019-09-28 NOTE — Progress Notes (Signed)
Established Patient Office Visit  Subjective:  Patient ID: Meghan Nolan, female    DOB: Dec 16, 1990  Age: 29 y.o. MRN: [id number]  CC:  Chief Complaint  Patient presents with  . Follow-up    x3 mos on medical conditions. Patient has no other concerns     HPI Meghan Nolan presents for 3 mo recheck on Adderall.  Doing well. No AEs. Good effect. 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  Past Medical History:  Diagnosis Date  . ADHD (attention deficit hyperactivity disorder)   . Asthma   . Depression     Past Surgical History:  Procedure Laterality Date  . NO PAST SURGERIES      Family History  Problem Relation Age of Onset  . Diabetes Mother   . Breast cancer Mother   . Colon polyps Mother   . Heart disease Father   . Bleeding Disorder Father   . Diabetes Father   . Neurofibromatosis Brother   . Learning disabilities Brother     Social History   Socioeconomic History  . Marital status: Single    Spouse name: Not on file  . Number of children: 1  . Years of education: Not on file  . Highest education level: Not on file  Occupational History  . Not on file  Tobacco Use  . Smoking status: Former Smoker    Quit date: 07/16/2015    Years since quitting: 4.2  . Smokeless tobacco: Never Used  Vaping Use  . Vaping Use: Never used  Substance and Sexual Activity  . Alcohol use: No    Alcohol/week: 0.0 standard drinks  . Drug use: No  . Sexual activity: Not Currently  Other Topics Concern  . Not on file  Social History Narrative  . Not on file   Social Determinants of Health   Financial Resource Strain: Low Risk   . Difficulty of Paying Living Expenses: Not hard at all  Food Insecurity: No Food Insecurity  . Worried About Programme researcher, broadcasting/film/video in the Last Year: Never true  . Ran Out of Food  in the Last Year: Never true  Transportation Needs: No Transportation Needs  . Lack of Transportation (Medical): No  . Lack of Transportation (Non-Medical): No  Physical Activity: Sufficiently Active  . Days of Exercise per Week: 5 days  . Minutes of Exercise per Session: 30 min  Stress: Stress Concern Present  . Feeling of Stress : To some extent  Social Connections: Unknown  . Frequency of Communication with Friends and Family: Three times a week  . Frequency of Social Gatherings with Friends and Family: Twice a week  . Attends Religious Services: Patient refused  . Active Member of Clubs or Organizations: Patient refused  . Attends Banker Meetings: Patient refused  . Marital Status: Patient refused  Intimate Partner Violence: Not At Risk  . Fear of Current or Ex-Partner: No  . Emotionally Abused: No  . Physically Abused: No  . Sexually Abused: No    Outpatient Medications Prior to Visit  Medication Sig Dispense Refill  . amphetamine-dextroamphetamine (ADDERALL) 10 MG tablet Take 1 tablet (10 mg total) by mouth daily with lunch. 90 tablet 0  . amphetamine-dextroamphetamine (ADDERALL) 20 MG tablet Take 1 tablet (20 mg total) by mouth daily. 90 tablet 0  . medroxyPROGESTERone  Acetate (DEPO-PROVERA) 150 MG/ML SUSY Depo-Provera 150 mg/mL intramuscular syringe  Inject 1 mL every 3 months by intramuscular route for 90 days.     No facility-administered medications prior to visit.    Allergies  Allergen Reactions  . Penicillins Shortness Of Breath    Has patient had a PCN reaction causing immediate rash, facial/tongue/throat swelling, SOB or lightheadedness with hypotension: Yes Has patient had a PCN reaction causing severe rash involving mucus membranes or skin necrosis: Yes Has patient had a PCN reaction that required hospitalization Yes Has patient had a PCN reaction occurring within the last 10 years: No If all of the above answers are "NO", then may proceed with  Cephalosporin use.     ROS Review of Systems  Constitutional: Negative.   HENT: Negative.   Eyes: Negative.   Respiratory: Negative.   Cardiovascular: Negative.   Gastrointestinal: Negative.   Endocrine: Negative.   Genitourinary: Negative.   Musculoskeletal: Negative.   Skin: Negative.   Allergic/Immunologic: Negative.   Neurological: Negative.   Hematological: Negative.   Psychiatric/Behavioral: Negative.   All other systems reviewed and are negative.     Objective:    Physical Exam Vitals and nursing note reviewed.  Constitutional:      General: She is not in acute distress.    Appearance: Normal appearance. She is normal weight. She is not ill-appearing, toxic-appearing or diaphoretic.  Cardiovascular:     Rate and Rhythm: Normal rate and regular rhythm.  Pulmonary:     Effort: Pulmonary effort is normal. No respiratory distress.  Skin:    Capillary Refill: Capillary refill takes less than 2 seconds.  Neurological:     General: No focal deficit present.     Mental Status: She is alert and oriented to person, place, and time. Mental status is at baseline.  Psychiatric:        Mood and Affect: Mood normal.        Behavior: Behavior normal.        Thought Content: Thought content normal.        Judgment: Judgment normal.     BP 118/67   Pulse (!) 117   Temp 98 F (36.7 C) (Temporal)   Resp 18   Ht 5' 5.5" (1.664 m)   Wt 157 lb 9.6 oz (71.5 kg)   SpO2 100%   BMI 25.83 kg/m  Wt Readings from Last 3 Encounters:  09/28/19 157 lb 9.6 oz (71.5 kg)  06/28/19 157 lb 6.4 oz (71.4 kg)  03/29/19 155 lb (70.3 kg)     There are no preventive care reminders to display for this patient.  There are no preventive care reminders to display for this patient.  Lab Results  Component Value Date   TSH 0.618 11/24/2018   Lab Results  Component Value Date   WBC 6.1 11/24/2018   HGB 14.3 11/24/2018   HCT 44.7 11/24/2018   MCV 89 11/24/2018   PLT 191 11/24/2018     Lab Results  Component Value Date   NA 140 11/24/2018   K 3.8 11/24/2018   CO2 21 11/24/2018   GLUCOSE 89 11/24/2018   BUN 7 11/24/2018   CREATININE 0.67 11/24/2018   BILITOT 0.7 11/24/2018   ALKPHOS 60 11/24/2018   AST 16 11/24/2018   ALT 11 11/24/2018   PROT 6.8 11/24/2018   ALBUMIN 4.8 11/24/2018   CALCIUM 9.5 11/24/2018   Lab Results  Component Value Date   CHOL 146 11/24/2018   Lab Results  Component Value Date   HDL 48 11/24/2018   Lab Results  Component Value Date   LDLCALC 89 11/24/2018   Lab Results  Component Value Date   TRIG 46 11/24/2018   Lab Results  Component Value Date   CHOLHDL 3.0 11/24/2018   Lab Results  Component Value Date   HGBA1C 5.2 11/24/2018      Assessment & Plan:   Problem List Items Addressed This Visit      Other   ADD (attention deficit disorder)   Relevant Medications   amphetamine-dextroamphetamine (ADDERALL) 10 MG tablet   amphetamine-dextroamphetamine (ADDERALL) 20 MG tablet      Meds ordered this encounter  Medications  . amphetamine-dextroamphetamine (ADDERALL) 10 MG tablet    Sig: Take 1 tablet (10 mg total) by mouth daily with lunch.    Dispense:  90 tablet    Refill:  0    Order Specific Question:   Supervising Provider    Answer:   Neva Seat, JEFFREY R [[id number]]  . amphetamine-dextroamphetamine (ADDERALL) 20 MG tablet    Sig: Take 1 tablet (20 mg total) by mouth daily.    Dispense:  90 tablet    Refill:  0    Order Specific Question:   Supervising Provider    Answer:   Neva Seat, JEFFREY R [[id number]]    Follow-up: No follow-ups on file.   PLAN  Refill x 3 mo  Encouraged cpe  Return sooner if needed.  Patient encouraged to call clinic with any questions, comments, or concerns.  Janeece Agee, NP

## 2019-09-28 NOTE — Patient Instructions (Signed)
° ° ° °  If you have lab work done today you will be contacted with your lab results within the next 2 weeks.  If you have not heard from us then please contact us. The fastest way to get your results is to register for My Chart. ° ° °IF you received an x-ray today, you will receive an invoice from [facility]. Please contact [facility] at [phone] with questions or concerns regarding your invoice.  ° °IF you received labwork today, you will receive an invoice from [facility]. Please contact [facility] at [phone] with questions or concerns regarding your invoice.  ° °Our billing staff will not be able to assist you with questions regarding bills from these companies. ° °You will be contacted with the lab results as soon as they are available. The fastest way to get your results is to activate your My Chart account. Instructions are located on the last page of this paperwork. If you have not heard from us regarding the results in 2 weeks, please contact this office. °  ° ° ° °

## 2019-12-21 ENCOUNTER — Encounter: Payer: Self-pay | Admitting: Registered Nurse

## 2019-12-21 ENCOUNTER — Other Ambulatory Visit: Payer: Self-pay

## 2019-12-21 ENCOUNTER — Ambulatory Visit: Payer: BC Managed Care – PPO | Admitting: Registered Nurse

## 2019-12-21 VITALS — BP 117/75 | HR 98 | Temp 98.5°F | Resp 18 | Ht 65.5 in | Wt 144.0 lb

## 2019-12-21 DIAGNOSIS — F988 Other specified behavioral and emotional disorders with onset usually occurring in childhood and adolescence: Secondary | ICD-10-CM

## 2019-12-21 DIAGNOSIS — L02811 Cutaneous abscess of head [any part, except face]: Secondary | ICD-10-CM

## 2019-12-21 MED ORDER — DOXYCYCLINE HYCLATE 100 MG PO TABS: 100.0000 mg | ORAL_TABLET | Freq: Two times a day (BID) | ORAL | 0 refills | Status: DC

## 2019-12-21 MED ORDER — LISDEXAMFETAMINE DIMESYLATE 30 MG PO CAPS: 30.0000 mg | ORAL_CAPSULE | Freq: Every day | ORAL | 0 refills | Status: DC

## 2019-12-21 NOTE — Patient Instructions (Signed)
° ° ° °  If you have lab work done today you will be contacted with your lab results within the next 2 weeks.  If you have not heard from us then please contact us. The fastest way to get your results is to register for My Chart. ° ° °IF you received an x-ray today, you will receive an invoice from [facility]. Please contact [facility] at [phone] with questions or concerns regarding your invoice.  ° °IF you received labwork today, you will receive an invoice from [facility]. Please contact [facility] at [phone] with questions or concerns regarding your invoice.  ° °Our billing staff will not be able to assist you with questions regarding bills from these companies. ° °You will be contacted with the lab results as soon as they are available. The fastest way to get your results is to activate your My Chart account. Instructions are located on the last page of this paperwork. If you have not heard from us regarding the results in 2 weeks, please contact this office. °  ° ° ° °

## 2019-12-21 NOTE — Progress Notes (Signed)
Established Patient Office Visit  Subjective:  Patient ID: Meghan Nolan, female    DOB: 1990/07/30  Age: 29 y.o. MRN: [id number]  CC:  Chief Complaint  Patient presents with  . Cyst    patient states she has been noticing some knots on the back of her head since 8/302021. Per patient they were blisters but they went away now they knots and extremely painful , and feels like somebody is constantly pulling her hair.  . Cyst    patient states she is noticing them on her right side of the neck but they are soft    HPI Meghan Nolan presents for bumps on head.  First noticed on Aug 30. Multiple firm bumps on back of head in hair. No drainage. Mildly ttp. Noted after that some bumps appears along trapezius. These are smaller, more firm, mobile and tender.  No dysphagia, globus sensation, nvd, shob, doe, fevers, chills, or fatigue  Also notes that she got new insurance. Would prefer to switch back to Vyvanse if possible. Had been on 30mg  PO qd for some time. Prefers this to adderall. No AEs, no concerns or complaints. Symptoms having moderate overall control but was better on vyvanse. PDMP consulted.   Past Medical History:  Diagnosis Date  . ADHD (attention deficit hyperactivity disorder)   . Asthma   . Depression     Past Surgical History:  Procedure Laterality Date  . NO PAST SURGERIES      Family History  Problem Relation Age of Onset  . Diabetes Mother   . Breast cancer Mother   . Colon polyps Mother   . Heart disease Father   . Bleeding Disorder Father   . Diabetes Father   . Neurofibromatosis Brother   . Learning disabilities Brother     Social History   Socioeconomic History  . Marital status: Single    Spouse name: Not on file  . Number of children: 1  . Years of education: Not on file  . Highest education level: Not on file  Occupational History  . Not on file  Tobacco Use  . Smoking status: Former Smoker    Quit date: 07/16/2015    Years since  quitting: 4.4  . Smokeless tobacco: Never Used  Vaping Use  . Vaping Use: Never used  Substance and Sexual Activity  . Alcohol use: No    Alcohol/week: 0.0 standard drinks  . Drug use: No  . Sexual activity: Not Currently  Other Topics Concern  . Not on file  Social History Narrative  . Not on file   Social Determinants of Health   Financial Resource Strain:   . Difficulty of Paying Living Expenses: Not on file  Food Insecurity:   . Worried About 09/15/2015 in the Last Year: Not on file  . Ran Out of Food in the Last Year: Not on file  Transportation Needs:   . Lack of Transportation (Medical): Not on file  . Lack of Transportation (Non-Medical): Not on file  Physical Activity:   . Days of Exercise per Week: Not on file  . Minutes of Exercise per Session: Not on file  Stress:   . Feeling of Stress : Not on file  Social Connections:   . Frequency of Communication with Friends and Family: Not on file  . Frequency of Social Gatherings with Friends and Family: Not on file  . Attends Religious Services: Not on file  . Active Member of Clubs or  Organizations: Not on file  . Attends Banker Meetings: Not on file  . Marital Status: Not on file  Intimate Partner Violence:   . Fear of Current or Ex-Partner: Not on file  . Emotionally Abused: Not on file  . Physically Abused: Not on file  . Sexually Abused: Not on file    Outpatient Medications Prior to Visit  Medication Sig Dispense Refill  . amphetamine-dextroamphetamine (ADDERALL) 10 MG tablet Take 1 tablet (10 mg total) by mouth daily with lunch. 90 tablet 0  . amphetamine-dextroamphetamine (ADDERALL) 20 MG tablet Take 1 tablet (20 mg total) by mouth daily. 90 tablet 0   No facility-administered medications prior to visit.    Allergies  Allergen Reactions  . Penicillins Shortness Of Breath    Has patient had a PCN reaction causing immediate rash, facial/tongue/throat swelling, SOB or  lightheadedness with hypotension: Yes Has patient had a PCN reaction causing severe rash involving mucus membranes or skin necrosis: Yes Has patient had a PCN reaction that required hospitalization Yes Has patient had a PCN reaction occurring within the last 10 years: No If all of the above answers are "NO", then may proceed with Cephalosporin use.     ROS Review of Systems  Constitutional: Negative.   HENT: Negative.   Eyes: Negative.   Respiratory: Negative.   Cardiovascular: Negative.   Gastrointestinal: Negative.   Endocrine: Negative.   Genitourinary: Negative.   Musculoskeletal: Negative.   Skin: Negative for color change, pallor, rash and wound.       Per hpi  Allergic/Immunologic: Negative.   Neurological: Negative.   Hematological: Negative.   Psychiatric/Behavioral: Negative.   All other systems reviewed and are negative.     Objective:    Physical Exam Vitals and nursing note reviewed.  Constitutional:      Appearance: Normal appearance. She is normal weight.  Neck:     Vascular: No carotid bruit.  Cardiovascular:     Rate and Rhythm: Normal rate and regular rhythm.  Pulmonary:     Effort: Pulmonary effort is normal. No respiratory distress.  Musculoskeletal:     Cervical back: Normal range of motion and neck supple. No rigidity or tenderness.  Lymphadenopathy:     Cervical: Cervical adenopathy (R side cervical chain adenopathy.) present.  Skin:    General: Skin is warm and dry.     Findings: Lesion (firm lesions in hair on back of head. feel cystic. no clear head - no drainage. no erythema or signs of cellulitis. ) present.  Neurological:     General: No focal deficit present.     Mental Status: She is alert and oriented to person, place, and time. Mental status is at baseline.  Psychiatric:        Mood and Affect: Mood normal.        Behavior: Behavior normal.        Thought Content: Thought content normal.        Judgment: Judgment normal.      BP 117/75   Pulse 98   Temp 98.5 F (36.9 C) (Temporal)   Resp 18   Ht 5' 5.5" (1.664 m)   Wt 144 lb (65.3 kg)   SpO2 100%   BMI 23.60 kg/m  Wt Readings from Last 3 Encounters:  12/21/19 144 lb (65.3 kg)  09/28/19 157 lb 9.6 oz (71.5 kg)  06/28/19 157 lb 6.4 oz (71.4 kg)     There are no preventive care reminders to display for this  patient.  There are no preventive care reminders to display for this patient.  Lab Results  Component Value Date   TSH 0.618 11/24/2018   Lab Results  Component Value Date   WBC 6.1 11/24/2018   HGB 14.3 11/24/2018   HCT 44.7 11/24/2018   MCV 89 11/24/2018   PLT 191 11/24/2018   Lab Results  Component Value Date   NA 140 11/24/2018   K 3.8 11/24/2018   CO2 21 11/24/2018   GLUCOSE 89 11/24/2018   BUN 7 11/24/2018   CREATININE 0.67 11/24/2018   BILITOT 0.7 11/24/2018   ALKPHOS 60 11/24/2018   AST 16 11/24/2018   ALT 11 11/24/2018   PROT 6.8 11/24/2018   ALBUMIN 4.8 11/24/2018   CALCIUM 9.5 11/24/2018   Lab Results  Component Value Date   CHOL 146 11/24/2018   Lab Results  Component Value Date   HDL 48 11/24/2018   Lab Results  Component Value Date   LDLCALC 89 11/24/2018   Lab Results  Component Value Date   TRIG 46 11/24/2018   Lab Results  Component Value Date   CHOLHDL 3.0 11/24/2018   Lab Results  Component Value Date   HGBA1C 5.2 11/24/2018      Assessment & Plan:   Problem List Items Addressed This Visit      Other   ADD (attention deficit disorder) - Primary   Relevant Medications   lisdexamfetamine (VYVANSE) 30 MG capsule      Meds ordered this encounter  Medications  . lisdexamfetamine (VYVANSE) 30 MG capsule    Sig: Take 1 capsule (30 mg total) by mouth daily.    Dispense:  30 capsule    Refill:  0    Order Specific Question:   Supervising Provider    Answer:   Neva Seat, JEFFREY R [[id number]]    Follow-up: No follow-ups on file.   PLAN  Cysts on back of head - likely giving rise  to the lymphadenopathy.   Will give doxycycline 100mg  PO bid for 7 days   Monitor for improvement  Low risk for hematological abnormality or viral infection, but will pursue those if warranted  Send rx for Vyvanse 30mg  PO qd. May refill 3 times before next visit. If too expensive, will resume adderall at previous dose.  Patient encouraged to call clinic with any questions, comments, or concerns.  , NP

## 2019-12-26 ENCOUNTER — Encounter: Payer: Self-pay | Admitting: Registered Nurse

## 2019-12-27 ENCOUNTER — Encounter: Payer: Self-pay | Admitting: Registered Nurse

## 2019-12-27 ENCOUNTER — Other Ambulatory Visit: Payer: Self-pay

## 2019-12-27 ENCOUNTER — Ambulatory Visit: Payer: BC Managed Care – PPO | Admitting: Registered Nurse

## 2019-12-27 VITALS — BP 110/69 | HR 83 | Temp 98.0°F | Resp 18 | Ht 65.5 in | Wt 143.8 lb

## 2019-12-27 DIAGNOSIS — Z793 Long term (current) use of hormonal contraceptives: Secondary | ICD-10-CM

## 2019-12-27 DIAGNOSIS — Z3042 Encounter for surveillance of injectable contraceptive: Secondary | ICD-10-CM | POA: Diagnosis not present

## 2019-12-27 DIAGNOSIS — R591 Generalized enlarged lymph nodes: Secondary | ICD-10-CM

## 2019-12-27 MED ORDER — MEDROXYPROGESTERONE ACETATE 150 MG/ML IM SUSP
150.0000 mg | Freq: Once | INTRAMUSCULAR | Status: AC
  Administered 2019-12-27: 150 mg via INTRAMUSCULAR

## 2019-12-27 NOTE — Patient Instructions (Signed)
° ° ° °  If you have lab work done today you will be contacted with your lab results within the next 2 weeks.  If you have not heard from us then please contact us. The fastest way to get your results is to register for My Chart. ° ° °IF you received an x-ray today, you will receive an invoice from [facility]. Please contact [facility] at [phone] with questions or concerns regarding your invoice.  ° °IF you received labwork today, you will receive an invoice from [facility]. Please contact [facility] at [phone] with questions or concerns regarding your invoice.  ° °Our billing staff will not be able to assist you with questions regarding bills from these companies. ° °You will be contacted with the lab results as soon as they are available. The fastest way to get your results is to activate your My Chart account. Instructions are located on the last page of this paperwork. If you have not heard from us regarding the results in 2 weeks, please contact this office. °  ° ° ° °

## 2019-12-27 NOTE — Progress Notes (Signed)
Established Patient Office Visit  Subjective:  Patient ID: Meghan Nolan, female    DOB: 05/29/1990  Age: 29 y.o. MRN: [id number]  CC:  Chief Complaint  Patient presents with  . Follow-up    patient is here for follow up for lumps in her head. Per patient the pain has went away but some of the lumps are still the same size,    HPI Meghan Nolan presents for follow up   Feels like bumps on head have reduced somewhat. Less tender. Smaller. Still present, however. Question of more fatigue, but works full time and has 49 year old child - she has baseline fatigue.  Does note new sexual partner within past few months - upic. Using dmpa injections, due today.   No other new symptoms or complaints.  Past Medical History:  Diagnosis Date  . ADHD (attention deficit hyperactivity disorder)   . Asthma   . Depression     Past Surgical History:  Procedure Laterality Date  . NO PAST SURGERIES      Family History  Problem Relation Age of Onset  . Diabetes Mother   . Breast cancer Mother   . Colon polyps Mother   . Heart disease Father   . Bleeding Disorder Father   . Diabetes Father   . Neurofibromatosis Brother   . Learning disabilities Brother     Social History   Socioeconomic History  . Marital status: Single    Spouse name: Not on file  . Number of children: 1  . Years of education: Not on file  . Highest education level: Not on file  Occupational History  . Not on file  Tobacco Use  . Smoking status: Former Smoker    Quit date: 07/16/2015    Years since quitting: 4.4  . Smokeless tobacco: Never Used  Vaping Use  . Vaping Use: Never used  Substance and Sexual Activity  . Alcohol use: No    Alcohol/week: 0.0 standard drinks  . Drug use: No  . Sexual activity: Not Currently  Other Topics Concern  . Not on file  Social History Narrative  . Not on file   Social Determinants of Health   Financial Resource Strain:   . Difficulty of Paying Living Expenses:  Not on file  Food Insecurity:   . Worried About Programme researcher, broadcasting/film/video in the Last Year: Not on file  . Ran Out of Food in the Last Year: Not on file  Transportation Needs:   . Lack of Transportation (Medical): Not on file  . Lack of Transportation (Non-Medical): Not on file  Physical Activity:   . Days of Exercise per Week: Not on file  . Minutes of Exercise per Session: Not on file  Stress:   . Feeling of Stress : Not on file  Social Connections:   . Frequency of Communication with Friends and Family: Not on file  . Frequency of Social Gatherings with Friends and Family: Not on file  . Attends Religious Services: Not on file  . Active Member of Clubs or Organizations: Not on file  . Attends Banker Meetings: Not on file  . Marital Status: Not on file  Intimate Partner Violence:   . Fear of Current or Ex-Partner: Not on file  . Emotionally Abused: Not on file  . Physically Abused: Not on file  . Sexually Abused: Not on file    Outpatient Medications Prior to Visit  Medication Sig Dispense Refill  . amphetamine-dextroamphetamine (  ADDERALL) 10 MG tablet Take 1 tablet (10 mg total) by mouth daily with lunch. 90 tablet 0  . amphetamine-dextroamphetamine (ADDERALL) 20 MG tablet Take 1 tablet (20 mg total) by mouth daily. 90 tablet 0  . doxycycline (VIBRA-TABS) 100 MG tablet Take 1 tablet (100 mg total) by mouth 2 (two) times daily. 14 tablet 0  . lisdexamfetamine (VYVANSE) 30 MG capsule Take 1 capsule (30 mg total) by mouth daily. (Patient not taking: Reported on 12/27/2019) 30 capsule 0   No facility-administered medications prior to visit.    Allergies  Allergen Reactions  . Penicillins Shortness Of Breath    Has patient had a PCN reaction causing immediate rash, facial/tongue/throat swelling, SOB or lightheadedness with hypotension: Yes Has patient had a PCN reaction causing severe rash involving mucus membranes or skin necrosis: Yes Has patient had a PCN reaction  that required hospitalization Yes Has patient had a PCN reaction occurring within the last 10 years: No If all of the above answers are "NO", then may proceed with Cephalosporin use.     ROS Review of Systems  Constitutional: Positive for fatigue. Negative for activity change, appetite change, chills, diaphoresis, fever and unexpected weight change.  HENT: Negative.   Eyes: Negative.   Respiratory: Negative.   Cardiovascular: Negative.   Gastrointestinal: Negative.   Endocrine: Negative.   Genitourinary: Negative.   Musculoskeletal: Negative.   Skin: Negative.   Allergic/Immunologic: Negative.   Neurological: Negative.   Hematological: Negative.   Psychiatric/Behavioral: Negative.       Objective:    Physical Exam Vitals and nursing note reviewed.  Constitutional:      Appearance: Normal appearance. She is normal weight.  Neck:     Vascular: No carotid bruit.  Cardiovascular:     Rate and Rhythm: Normal rate and regular rhythm.  Pulmonary:     Effort: Pulmonary effort is normal. No respiratory distress.  Musculoskeletal:     Cervical back: Normal range of motion and neck supple. No rigidity or tenderness.  Lymphadenopathy:     Cervical: Cervical adenopathy (R side cervical chain adenopathy.) present.  Skin:    General: Skin is warm and dry.     Findings: Lesion (firm lesions in hair on back of head. feel cystic. no clear head - no drainage. no erythema or signs of cellulitis. ) present.  Neurological:     General: No focal deficit present.     Mental Status: She is alert and oriented to person, place, and time. Mental status is at baseline.  Psychiatric:        Mood and Affect: Mood normal.        Behavior: Behavior normal.        Thought Content: Thought content normal.        Judgment: Judgment normal.     BP 110/69   Pulse 83   Temp 98 F (36.7 C) (Temporal)   Resp 18   Ht 5' 5.5" (1.664 m)   Wt 143 lb 12.8 oz (65.2 kg)   SpO2 100%   BMI 23.57 kg/m    Wt Readings from Last 3 Encounters:  12/27/19 143 lb 12.8 oz (65.2 kg)  12/21/19 144 lb (65.3 kg)  09/28/19 157 lb 9.6 oz (71.5 kg)     There are no preventive care reminders to display for this patient.  There are no preventive care reminders to display for this patient.  Lab Results  Component Value Date   TSH 0.618 11/24/2018   Lab Results  Component Value Date   WBC 6.1 11/24/2018   HGB 14.3 11/24/2018   HCT 44.7 11/24/2018   MCV 89 11/24/2018   PLT 191 11/24/2018   Lab Results  Component Value Date   NA 140 11/24/2018   K 3.8 11/24/2018   CO2 21 11/24/2018   GLUCOSE 89 11/24/2018   BUN 7 11/24/2018   CREATININE 0.67 11/24/2018   BILITOT 0.7 11/24/2018   ALKPHOS 60 11/24/2018   AST 16 11/24/2018   ALT 11 11/24/2018   PROT 6.8 11/24/2018   ALBUMIN 4.8 11/24/2018   CALCIUM 9.5 11/24/2018   Lab Results  Component Value Date   CHOL 146 11/24/2018   Lab Results  Component Value Date   HDL 48 11/24/2018   Lab Results  Component Value Date   LDLCALC 89 11/24/2018   Lab Results  Component Value Date   TRIG 46 11/24/2018   Lab Results  Component Value Date   CHOLHDL 3.0 11/24/2018   Lab Results  Component Value Date   HGBA1C 5.2 11/24/2018      Assessment & Plan:   Problem List Items Addressed This Visit    None    Visit Diagnoses    Lymphadenopathy of head and neck    -  Primary   Relevant Orders   CBC With Differential   TSH   CMV abs, IgG+IgM (cytomegalovirus)   Mononucleosis screen   HIV antibody (with reflex)   ANA w/Reflex if Positive   Uses 38-month hormonal injection as primary birth control method       Relevant Medications   medroxyPROGESTERone (DEPO-PROVERA) injection 150 mg (Completed)      Meds ordered this encounter  Medications  . medroxyPROGESTERone (DEPO-PROVERA) injection 150 mg    Follow-up: No follow-ups on file.   PLAN  Labs collected. Given doxycycline use, starting to suspect another etiology beyond  bacterial - will test for viruses and CBC and TSH  Will follow up as warranted  Given dmpa injection - advised of return dates  Patient encouraged to call clinic with any questions, comments, or concerns.  Janeece Agee, NP

## 2019-12-28 LAB — CBC WITH DIFFERENTIAL
Basophils Absolute: 0 10*3/uL (ref 0.0–0.2)
Basos: 1 %
EOS (ABSOLUTE): 0.1 10*3/uL (ref 0.0–0.4)
Eos: 2 %
Hematocrit: 44 % (ref 34.0–46.6)
Hemoglobin: 14.1 g/dL (ref 11.1–15.9)
Immature Grans (Abs): 0 10*3/uL (ref 0.0–0.1)
Immature Granulocytes: 0 %
Lymphocytes Absolute: 1.9 10*3/uL (ref 0.7–3.1)
Lymphs: 43 %
MCH: 27.8 pg (ref 26.6–33.0)
MCHC: 32 g/dL (ref 31.5–35.7)
MCV: 87 fL (ref 79–97)
Monocytes Absolute: 0.3 10*3/uL (ref 0.1–0.9)
Monocytes: 8 %
Neutrophils Absolute: 2.1 10*3/uL (ref 1.4–7.0)
Neutrophils: 46 %
RBC: 5.07 x10E6/uL (ref 3.77–5.28)
RDW: 12.7 % (ref 11.7–15.4)
WBC: 4.5 10*3/uL (ref 3.4–10.8)

## 2019-12-28 LAB — HIV ANTIBODY (ROUTINE TESTING W REFLEX): HIV Screen 4th Generation wRfx: NONREACTIVE

## 2019-12-28 LAB — CMV ABS, IGG+IGM (CYTOMEGALOVIRUS)
CMV Ab - IgG: >10 U/mL — ABNORMAL HIGH (ref 0.00–0.59)
CMV IgM Ser EIA-aCnc: <30 AU/mL (ref 0.0–29.9)

## 2019-12-28 LAB — ANA W/REFLEX IF POSITIVE: Anti Nuclear Antibody (ANA): NEGATIVE

## 2019-12-28 LAB — MONONUCLEOSIS SCREEN: Mono Screen: NEGATIVE

## 2019-12-28 LAB — TSH: TSH: 0.159 u[IU]/mL — ABNORMAL LOW (ref 0.450–4.500)

## 2019-12-29 ENCOUNTER — Other Ambulatory Visit: Payer: Self-pay | Admitting: Registered Nurse

## 2019-12-29 DIAGNOSIS — F988 Other specified behavioral and emotional disorders with onset usually occurring in childhood and adolescence: Secondary | ICD-10-CM

## 2019-12-30 ENCOUNTER — Ambulatory Visit: Payer: PRIVATE HEALTH INSURANCE | Admitting: Registered Nurse

## 2019-12-30 MED ORDER — AMPHETAMINE-DEXTROAMPHETAMINE 20 MG PO TABS: 20.0000 mg | ORAL_TABLET | Freq: Every day | ORAL | 0 refills | Status: DC

## 2019-12-30 MED ORDER — AMPHETAMINE-DEXTROAMPHETAMINE 10 MG PO TABS: 10.0000 mg | ORAL_TABLET | Freq: Every day | ORAL | 0 refills | Status: DC

## 2019-12-30 NOTE — Telephone Encounter (Signed)
Patient is requesting a refill of the following medications: Requested Prescriptions   Pending Prescriptions Disp Refills   amphetamine-dextroamphetamine (ADDERALL) 10 MG tablet 90 tablet 0    Sig: Take 1 tablet (10 mg total) by mouth daily with lunch.   amphetamine-dextroamphetamine (ADDERALL) 20 MG tablet 90 tablet 0    Sig: Take 1 tablet (20 mg total) by mouth daily.    Date of patient request: 12/30/2019 Last office visit: 12/27/2019 Date of last refill: 09/28/2019 Last refill amount: 90 Follow up time period per chart: N/A

## 2020-03-14 ENCOUNTER — Ambulatory Visit: Payer: BC Managed Care – PPO | Admitting: Registered Nurse

## 2020-03-14 ENCOUNTER — Encounter: Payer: Self-pay | Admitting: Registered Nurse

## 2020-03-14 ENCOUNTER — Other Ambulatory Visit: Payer: Self-pay

## 2020-03-14 VITALS — BP 124/74 | HR 108 | Temp 98.8°F | Resp 18 | Ht 65.5 in | Wt 145.8 lb

## 2020-03-14 DIAGNOSIS — L659 Nonscarring hair loss, unspecified: Secondary | ICD-10-CM | POA: Diagnosis not present

## 2020-03-14 DIAGNOSIS — R7989 Other specified abnormal findings of blood chemistry: Secondary | ICD-10-CM

## 2020-03-14 DIAGNOSIS — Z793 Long term (current) use of hormonal contraceptives: Secondary | ICD-10-CM

## 2020-03-14 DIAGNOSIS — Z3042 Encounter for surveillance of injectable contraceptive: Secondary | ICD-10-CM | POA: Diagnosis not present

## 2020-03-14 MED ORDER — MEDROXYPROGESTERONE ACETATE 150 MG/ML IM SUSP
150.0000 mg | Freq: Once | INTRAMUSCULAR | Status: AC
  Administered 2020-03-14: 150 mg via INTRAMUSCULAR

## 2020-03-14 NOTE — Progress Notes (Signed)
Patient states she is here for Depo Shot patient received it in the left deltoid and tolerated it well. Patient was told to return for next injection Between Feb 16- March 2

## 2020-03-14 NOTE — Patient Instructions (Signed)
° ° ° °  If you have lab work done today you will be contacted with your lab results within the next 2 weeks.  If you have not heard from us then please contact us. The fastest way to get your results is to register for My Chart. ° ° °IF you received an x-ray today, you will receive an invoice from [facility]. Please contact  Radiology at [phone] with questions or concerns regarding your invoice.  ° °IF you received labwork today, you will receive an invoice from [facility]. Please contact [facility] at [phone] with questions or concerns regarding your invoice.  ° °Our billing staff will not be able to assist you with questions regarding bills from these companies. ° °You will be contacted with the lab results as soon as they are available. The fastest way to get your results is to activate your My Chart account. Instructions are located on the last page of this paperwork. If you have not heard from us regarding the results in 2 weeks, please contact this office. °  ° ° ° °

## 2020-03-15 ENCOUNTER — Other Ambulatory Visit: Payer: Self-pay | Admitting: Registered Nurse

## 2020-03-15 DIAGNOSIS — F988 Other specified behavioral and emotional disorders with onset usually occurring in childhood and adolescence: Secondary | ICD-10-CM

## 2020-03-15 LAB — CBC
Hematocrit: 45.8 % (ref 34.0–46.6)
Hemoglobin: 15.7 g/dL (ref 11.1–15.9)
MCH: 29.2 pg (ref 26.6–33.0)
MCHC: 34.3 g/dL (ref 31.5–35.7)
MCV: 85 fL (ref 79–97)
Platelets: 247 10*3/uL (ref 150–450)
RBC: 5.38 x10E6/uL — ABNORMAL HIGH (ref 3.77–5.28)
RDW: 12.4 % (ref 11.7–15.4)
WBC: 6.1 10*3/uL (ref 3.4–10.8)

## 2020-03-15 LAB — IRON,TIBC AND FERRITIN PANEL
Ferritin: 145 ng/mL (ref 15–150)
Iron Saturation: 19 % (ref 15–55)
Iron: 71 ug/dL (ref 27–159)
Total Iron Binding Capacity: 376 ug/dL (ref 250–450)
UIBC: 305 ug/dL (ref 131–425)

## 2020-03-15 LAB — THYROID PANEL WITH TSH
Free Thyroxine Index: 1.9 (ref 1.2–4.9)
T3 Uptake Ratio: 26 % (ref 24–39)
T4, Total: 7.2 ug/dL (ref 4.5–12.0)
TSH: 0.415 u[IU]/mL — ABNORMAL LOW (ref 0.450–4.500)

## 2020-03-16 MED ORDER — LISDEXAMFETAMINE DIMESYLATE 30 MG PO CAPS: 30.0000 mg | ORAL_CAPSULE | Freq: Every day | ORAL | 0 refills | Status: DC

## 2020-03-21 ENCOUNTER — Encounter: Payer: Self-pay | Admitting: Registered Nurse

## 2020-06-06 ENCOUNTER — Encounter: Payer: Self-pay | Admitting: Registered Nurse

## 2020-06-12 ENCOUNTER — Ambulatory Visit (INDEPENDENT_AMBULATORY_CARE_PROVIDER_SITE_OTHER): Payer: BC Managed Care – PPO | Admitting: Registered Nurse

## 2020-06-12 ENCOUNTER — Other Ambulatory Visit: Payer: Self-pay

## 2020-06-12 DIAGNOSIS — Z3042 Encounter for surveillance of injectable contraceptive: Secondary | ICD-10-CM

## 2020-06-12 DIAGNOSIS — Z793 Long term (current) use of hormonal contraceptives: Secondary | ICD-10-CM

## 2020-06-12 MED ORDER — MEDROXYPROGESTERONE ACETATE 150 MG/ML IM SUSP
150.0000 mg | Freq: Once | INTRAMUSCULAR | Status: AC
  Administered 2020-06-12: 150 mg via INTRAMUSCULAR

## 2020-06-12 NOTE — Progress Notes (Signed)
Patient was informed of the indentation  that this medication can cause over time by getting them in the deltoid area. Patient stated she understood and ok with the injection in the deltoid area.

## 2020-06-14 ENCOUNTER — Encounter: Payer: Self-pay | Admitting: Registered Nurse

## 2020-06-14 NOTE — Progress Notes (Signed)
Established Patient Office Visit  Subjective:  Patient ID: Meghan Nolan, female    DOB: 1991-01-25  Age: 30 y.o. MRN: [id number]  CC:  Chief Complaint  Patient presents with  . Follow-up    Patient is here for 3 month follow up for bumps behind the ears and in her head. Per patient she is also returning for her depo shot.    HPI Meghan Nolan presents for depo shot and repeat labs  In window. Has been on depo for some time, no AEs. Aware of short and long term r/b/se. No complaints at this time.  Pt notes some mild fatigue ongoing. Discussed recent mono and lingering symptoms. No new bleeding, thyroid enlargement, or other symptoms. No fam hx of autoimmune d/o  Past Medical History:  Diagnosis Date  . ADHD (attention deficit hyperactivity disorder)   . Asthma   . Depression     Past Surgical History:  Procedure Laterality Date  . NO PAST SURGERIES      Family History  Problem Relation Age of Onset  . Diabetes Mother   . Breast cancer Mother   . Colon polyps Mother   . Heart disease Father   . Bleeding Disorder Father   . Diabetes Father   . Neurofibromatosis Brother   . Learning disabilities Brother     Social History   Socioeconomic History  . Marital status: Single    Spouse name: Not on file  . Number of children: 1  . Years of education: Not on file  . Highest education level: Not on file  Occupational History  . Not on file  Tobacco Use  . Smoking status: Former Smoker    Quit date: 07/16/2015    Years since quitting: 4.9  . Smokeless tobacco: Never Used  Vaping Use  . Vaping Use: Never used  Substance and Sexual Activity  . Alcohol use: No    Alcohol/week: 0.0 standard drinks  . Drug use: No  . Sexual activity: Not Currently  Other Topics Concern  . Not on file  Social History Narrative  . Not on file   Social Determinants of Health   Financial Resource Strain: Not on file  Food Insecurity: Not on file  Transportation Needs: Not on  file  Physical Activity: Not on file  Stress: Not on file  Social Connections: Not on file  Intimate Partner Violence: Not on file    Outpatient Medications Prior to Visit  Medication Sig Dispense Refill  . amphetamine-dextroamphetamine (ADDERALL) 10 MG tablet Take 1 tablet (10 mg total) by mouth daily with lunch. 90 tablet 0  . amphetamine-dextroamphetamine (ADDERALL) 20 MG tablet Take 1 tablet (20 mg total) by mouth daily. 90 tablet 0  . lisdexamfetamine (VYVANSE) 30 MG capsule Take 1 capsule (30 mg total) by mouth daily. 30 capsule 0  . doxycycline (VIBRA-TABS) 100 MG tablet Take 1 tablet (100 mg total) by mouth 2 (two) times daily. 14 tablet 0   No facility-administered medications prior to visit.    Allergies  Allergen Reactions  . Penicillins Shortness Of Breath    Has patient had a PCN reaction causing immediate rash, facial/tongue/throat swelling, SOB or lightheadedness with hypotension: Yes Has patient had a PCN reaction causing severe rash involving mucus membranes or skin necrosis: Yes Has patient had a PCN reaction that required hospitalization Yes Has patient had a PCN reaction occurring within the last 10 years: No If all of the above answers are "NO", then may proceed with  Cephalosporin use.     ROS Review of Systems  Constitutional: Positive for fatigue.  HENT: Negative.   Eyes: Negative.   Respiratory: Negative.   Cardiovascular: Negative.   Gastrointestinal: Negative.   Genitourinary: Negative.   Musculoskeletal: Negative.   Skin: Negative.   Neurological: Negative.   Psychiatric/Behavioral: Negative.   All other systems reviewed and are negative.     Objective:    Physical Exam Vitals and nursing note reviewed.  Constitutional:      General: She is not in acute distress.    Appearance: Normal appearance. She is normal weight. She is not ill-appearing, toxic-appearing or diaphoretic.  Cardiovascular:     Rate and Rhythm: Normal rate and regular  rhythm.     Heart sounds: Normal heart sounds. No murmur heard. No friction rub. No gallop.   Pulmonary:     Effort: Pulmonary effort is normal. No respiratory distress.     Breath sounds: Normal breath sounds. No stridor. No wheezing, rhonchi or rales.  Chest:     Chest wall: No tenderness.  Skin:    General: Skin is warm and dry.  Neurological:     General: No focal deficit present.     Mental Status: She is alert and oriented to person, place, and time. Mental status is at baseline.  Psychiatric:        Mood and Affect: Mood normal.        Behavior: Behavior normal.        Thought Content: Thought content normal.        Judgment: Judgment normal.     BP 124/74   Pulse (!) 108   Temp 98.8 F (37.1 C) (Temporal)   Resp 18   Ht 5' 5.5" (1.664 m)   Wt 145 lb 12.8 oz (66.1 kg)   SpO2 100%   BMI 23.89 kg/m  Wt Readings from Last 3 Encounters:  03/14/20 145 lb 12.8 oz (66.1 kg)  12/27/19 143 lb 12.8 oz (65.2 kg)  12/21/19 144 lb (65.3 kg)     Health Maintenance Due  Topic Date Due  . COVID-19 Vaccine (1) Never done    There are no preventive care reminders to display for this patient.  Lab Results  Component Value Date   TSH 0.415 (L) 03/14/2020   Lab Results  Component Value Date   WBC 6.1 03/14/2020   HGB 15.7 03/14/2020   HCT 45.8 03/14/2020   MCV 85 03/14/2020   PLT 247 03/14/2020   Lab Results  Component Value Date   NA 140 11/24/2018   K 3.8 11/24/2018   CO2 21 11/24/2018   GLUCOSE 89 11/24/2018   BUN 7 11/24/2018   CREATININE 0.67 11/24/2018   BILITOT 0.7 11/24/2018   ALKPHOS 60 11/24/2018   AST 16 11/24/2018   ALT 11 11/24/2018   PROT 6.8 11/24/2018   ALBUMIN 4.8 11/24/2018   CALCIUM 9.5 11/24/2018   Lab Results  Component Value Date   CHOL 146 11/24/2018   Lab Results  Component Value Date   HDL 48 11/24/2018   Lab Results  Component Value Date   LDLCALC 89 11/24/2018   Lab Results  Component Value Date   TRIG 46 11/24/2018    Lab Results  Component Value Date   CHOLHDL 3.0 11/24/2018   Lab Results  Component Value Date   HGBA1C 5.2 11/24/2018      Assessment & Plan:   Problem List Items Addressed This Visit   None   Visit  Diagnoses    Uses 19-month hormonal injection as primary birth control method    -  Primary   Low TSH level       Relevant Orders   Thyroid Panel With TSH (Completed)   Hair loss       Relevant Orders   Iron, TIBC and Ferritin Panel (Completed)   CBC (Completed)   Thyroid Panel With TSH (Completed)      Meds ordered this encounter  Medications  . medroxyPROGESTERone (DEPO-PROVERA) injection 150 mg    Follow-up: No follow-ups on file.   PLAN Labs collected. Will follow up with the patient as warranted. Depo given Return in appropriate window Refill adhd medication x 3 mo Patient encouraged to call clinic with any questions, comments, or concerns.  Janeece Agee, NP

## 2020-06-15 ENCOUNTER — Telehealth: Payer: Self-pay | Admitting: Registered Nurse

## 2020-06-15 NOTE — Telephone Encounter (Signed)
Called patient LVM  To call us back . Provider has availability on the 8th

## 2020-06-21 ENCOUNTER — Other Ambulatory Visit: Payer: Self-pay | Admitting: Registered Nurse

## 2020-06-21 DIAGNOSIS — F988 Other specified behavioral and emotional disorders with onset usually occurring in childhood and adolescence: Secondary | ICD-10-CM

## 2020-06-22 MED ORDER — LISDEXAMFETAMINE DIMESYLATE 30 MG PO CAPS: 30.0000 mg | ORAL_CAPSULE | Freq: Every day | ORAL | 0 refills | Status: DC

## 2020-06-22 NOTE — Telephone Encounter (Signed)
Patient is requesting a refill of the following medications: Requested Prescriptions   Pending Prescriptions Disp Refills   lisdexamfetamine (VYVANSE) 30 MG capsule 90 capsule 0    Sig: Take 1 capsule (30 mg total) by mouth daily.    Date of patient request: 06/21/20 Last office visit: 03/14/20 Date of last refill: 12/30/19 Last refill amount: 90 Follow up time period per chart:

## 2020-07-11 DIAGNOSIS — M5441 Lumbago with sciatica, right side: Secondary | ICD-10-CM | POA: Diagnosis not present

## 2020-07-11 DIAGNOSIS — M5451 Vertebrogenic low back pain: Secondary | ICD-10-CM | POA: Diagnosis not present

## 2020-07-11 DIAGNOSIS — M9903 Segmental and somatic dysfunction of lumbar region: Secondary | ICD-10-CM | POA: Diagnosis not present

## 2020-07-11 DIAGNOSIS — M5137 Other intervertebral disc degeneration, lumbosacral region: Secondary | ICD-10-CM | POA: Diagnosis not present

## 2020-07-13 ENCOUNTER — Encounter: Payer: Self-pay | Admitting: Registered Nurse

## 2020-07-13 NOTE — Telephone Encounter (Signed)
Called and spoke with patient and scheduled a virtual for Wednesday at 12:30 pm with Kateri Plummer. Patient thinks she is going to an Urgent care until then.

## 2020-07-18 ENCOUNTER — Telehealth (INDEPENDENT_AMBULATORY_CARE_PROVIDER_SITE_OTHER): Payer: BC Managed Care – PPO | Admitting: Registered Nurse

## 2020-07-18 DIAGNOSIS — M5441 Lumbago with sciatica, right side: Secondary | ICD-10-CM | POA: Diagnosis not present

## 2020-07-18 DIAGNOSIS — M5442 Lumbago with sciatica, left side: Secondary | ICD-10-CM | POA: Diagnosis not present

## 2020-07-18 DIAGNOSIS — G8929 Other chronic pain: Secondary | ICD-10-CM

## 2020-07-18 DIAGNOSIS — F411 Generalized anxiety disorder: Secondary | ICD-10-CM

## 2020-07-18 MED ORDER — METHOCARBAMOL 500 MG PO TABS: 500.0000 mg | ORAL_TABLET | Freq: Three times a day (TID) | ORAL | 0 refills | Status: DC | PRN

## 2020-07-18 MED ORDER — DICLOFENAC SODIUM 75 MG PO TBEC: 75.0000 mg | DELAYED_RELEASE_TABLET | Freq: Two times a day (BID) | ORAL | 0 refills | Status: DC

## 2020-07-18 MED ORDER — VENLAFAXINE HCL ER 37.5 MG PO CP24: 37.5000 mg | ORAL_CAPSULE | Freq: Every day | ORAL | 0 refills | Status: DC

## 2020-07-18 NOTE — Progress Notes (Signed)
Telemedicine Encounter- SOAP NOTE Established Patient  This telephone encounter was conducted with the patient's (or proxy's) verbal consent via audio telecommunications: yes  Patient was instructed to have this encounter in a suitably private space; and to only have persons present to whom they give permission to participate. In addition, patient identity was confirmed by use of name plus two identifiers (DOB and address).  I discussed the limitations, risks, security and privacy concerns of performing an evaluation and management service by telephone and the availability of in person appointments. I also discussed with the patient that there may be a patient responsible charge related to this service. The patient expressed understanding and agreed to proceed.  I spent a total of 30 minutes talking with the patient or their proxy.  Patient at work, in private place, secure Provider in office  Participants: [name], NP and [name]  Chief Complaint  Patient presents with  . Back Pain    Pt has L5 injury, pt would like second opinion saw a chiro who found the injury and now would like further evaluation to see about fixing this. Chiro recommended 3 adjustments a week for 3 months she doesn't see how this will help.  Picked up son wrong causing muscle spasms and pain, injury in June 2021, reports stinging pain but worsening since that time     Subjective   Meghan Nolan is a 30 y.o. established patient. Telephone visit today for back pain  HPI Hx of L5 injury. Would like second opinion Has been going to chiropractor for a while, but unsure of their treatment plan - has not seen results yet and they are recommending 3 adjustments weekly for some months, this is quite expensive and has no guarantee to help.  Notes that injury initially occurred picking up her son in June 2021.  Has been feeling spasms and pain since. OTCs have limited effect Back pain is becoming more  frequent and intense, impacting ADLs and work responsibilities ie cannot sweep or clean for any substantial period or, if she does, she cannot do much else for the remainder of the day. Denies saddle anesthesia, radicular symptoms, or further CNS symptoms.  Tearful at times when discussing the pain as she is very frustrated by it. We did discuss some aspects of mental health and how this can impact pain and vice versa. She acknowledges that her mental health has not always been doing well but would be open to thinking about a daily SSRI for anxiety.  Otherwise no concerns, feeling well   Patient Active Problem List   Diagnosis Date Noted  . ADD (attention deficit disorder) 07/25/2013  . Anxiety and depression 07/25/2013    Past Medical History:  Diagnosis Date  . ADHD (attention deficit hyperactivity disorder)   . Asthma   . Depression     Current Outpatient Medications  Medication Sig Dispense Refill  . lisdexamfetamine (VYVANSE) 30 MG capsule Take 1 capsule (30 mg total) by mouth daily. 90 capsule 0  . methocarbamol (ROBAXIN) 500 MG tablet Take 1 tablet (500 mg total) by mouth 3 (three) times daily as needed for muscle spasms. 60 tablet 0  . traMADol (ULTRAM) 50 MG tablet Take 1 tablet (50 mg total) by mouth every 8 (eight) hours as needed for up to 5 days. 15 tablet 0  . venlafaxine XR (EFFEXOR XR) 37.5 MG 24 hr capsule Take 1 capsule (37.5 mg total) by mouth daily with breakfast. 90 capsule 0  .  clindamycin (CLEOCIN) 300 MG capsule Take 1 capsule (300 mg total) by mouth 3 (three) times daily for 10 days. 30 capsule 0  . diclofenac (VOLTAREN) 75 MG EC tablet TAKE 1 TABLET(75 MG) BY MOUTH TWICE DAILY 30 tablet 0  . oxyCODONE (ROXICODONE) 5 MG immediate release tablet Take 1 tablet (5 mg total) by mouth every 6 (six) hours as needed for up to 10 days for breakthrough pain. 10 tablet 0   No current facility-administered medications for this visit.    Allergies  Allergen Reactions   . Penicillins Shortness Of Breath    Has patient had a PCN reaction causing immediate rash, facial/tongue/throat swelling, SOB or lightheadedness with hypotension: Yes Has patient had a PCN reaction causing severe rash involving mucus membranes or skin necrosis: Yes Has patient had a PCN reaction that required hospitalization Yes Has patient had a PCN reaction occurring within the last 10 years: No If all of the above answers are "NO", then may proceed with Cephalosporin use.     Social History   Socioeconomic History  . Marital status: Single    Spouse name: Not on file  . Number of children: 1  . Years of education: Not on file  . Highest education level: Not on file  Occupational History  . Not on file  Tobacco Use  . Smoking status: Former Smoker    Quit date: 07/16/2015    Years since quitting: 5.0  . Smokeless tobacco: Never Used  Vaping Use  . Vaping Use: Never used  Substance and Sexual Activity  . Alcohol use: No    Alcohol/week: 0.0 standard drinks  . Drug use: No  . Sexual activity: Not Currently  Other Topics Concern  . Not on file  Social History Narrative  . Not on file   Social Determinants of Health   Financial Resource Strain: Not on file  Food Insecurity: Not on file  Transportation Needs: Not on file  Physical Activity: Not on file  Stress: Not on file  Social Connections: Not on file  Intimate Partner Violence: Not on file    ROS Per hpi   Objective   Vitals as reported by the patient: There were no vitals filed for this visit.  Meghan Nolan was seen today for back pain.  Diagnoses and all orders for this visit:  GAD (generalized anxiety disorder) -     venlafaxine XR (EFFEXOR XR) 37.5 MG 24 hr capsule; Take 1 capsule (37.5 mg total) by mouth daily with breakfast.  Chronic midline low back pain with bilateral sciatica -     Discontinue: diclofenac (VOLTAREN) 75 MG EC tablet; Take 1 tablet (75 mg total) by mouth 2 (two) times daily. -      methocarbamol (ROBAXIN) 500 MG tablet; Take 1 tablet (500 mg total) by mouth 3 (three) times daily as needed for muscle spasms. -     DG Lumbar Spine Complete; Future -     DG Hip Unilat W OR W/O Pelvis 2-3 Views Left; Future -     DG Hip Unilat W OR W/O Pelvis 2-3 Views Right; Future -     traMADol (ULTRAM) 50 MG tablet; Take 1 tablet (50 mg total) by mouth every 8 (eight) hours as needed for up to 5 days.   PLAN  Order imaging on lower back. Methocarbamol and tramadol for pain. Will follow up based on imaging results. PT likely but will rule out fx  Start effexor 37.5mg  PO qd. Med check in 4-6 weeks  Patient encouraged to call clinic with any questions, comments, or concerns.   I discussed the assessment and treatment plan with the patient. The patient was provided an opportunity to ask questions and all were answered. The patient agreed with the plan and demonstrated an understanding of the instructions.   The patient was advised to call back or seek an in-person evaluation if the symptoms worsen or if the condition fails to improve as anticipated.  I provided 30 minutes of non-face-to-face time during this encounter.  Janeece Agee, NP  Primary [facility] at [facility]

## 2020-07-30 ENCOUNTER — Other Ambulatory Visit: Payer: Self-pay | Admitting: Registered Nurse

## 2020-07-30 DIAGNOSIS — G8929 Other chronic pain: Secondary | ICD-10-CM

## 2020-08-04 ENCOUNTER — Encounter (HOSPITAL_BASED_OUTPATIENT_CLINIC_OR_DEPARTMENT_OTHER): Payer: Self-pay | Admitting: *Deleted

## 2020-08-04 ENCOUNTER — Emergency Department (HOSPITAL_BASED_OUTPATIENT_CLINIC_OR_DEPARTMENT_OTHER)
Admission: EM | Admit: 2020-08-04 | Discharge: 2020-08-04 | Disposition: A | Discharge status: Home / Self Care | Payer: BC Managed Care – PPO | Attending: Emergency Medicine | Admitting: Emergency Medicine

## 2020-08-04 ENCOUNTER — Other Ambulatory Visit: Payer: Self-pay

## 2020-08-04 DIAGNOSIS — K0889 Other specified disorders of teeth and supporting structures: Secondary | ICD-10-CM

## 2020-08-04 DIAGNOSIS — J45909 Unspecified asthma, uncomplicated: Secondary | ICD-10-CM | POA: Insufficient documentation

## 2020-08-04 DIAGNOSIS — Z87891 Personal history of nicotine dependence: Secondary | ICD-10-CM | POA: Insufficient documentation

## 2020-08-04 DIAGNOSIS — S00531A Contusion of lip, initial encounter: Secondary | ICD-10-CM | POA: Diagnosis not present

## 2020-08-04 DIAGNOSIS — W541XXA Struck by dog, initial encounter: Secondary | ICD-10-CM | POA: Insufficient documentation

## 2020-08-04 DIAGNOSIS — S00502A Unspecified superficial injury of oral cavity, initial encounter: Secondary | ICD-10-CM | POA: Diagnosis not present

## 2020-08-04 MED ORDER — CLINDAMYCIN HCL 300 MG PO CAPS: 300.0000 mg | ORAL_CAPSULE | Freq: Three times a day (TID) | ORAL | 0 refills | Status: AC

## 2020-08-04 MED ORDER — OXYCODONE HCL 5 MG PO TABS: 5.0000 mg | ORAL_TABLET | Freq: Four times a day (QID) | ORAL | 0 refills | Status: AC | PRN

## 2020-08-04 MED ORDER — ACETAMINOPHEN 325 MG PO TABS
650.0000 mg | ORAL_TABLET | Freq: Once | ORAL | Status: AC
  Administered 2020-08-04: 650 mg via ORAL
  Filled 2020-08-04: qty 2

## 2020-08-04 NOTE — ED Provider Notes (Signed)
[facility] EMERGENCY DEPT Provider Note   CSN: [id number] Arrival date & time: 08/04/20  1357     History Chief Complaint  Patient presents with  . Mouth Injury    Meghan Nolan is a 30 y.o. female.  Swelling and abrasion to the bottom lip, pain to the upper teeth after dog ran into her.   Mouth Injury This is a new problem. The current episode started less than 1 hour ago. The problem occurs constantly. The problem has not changed since onset.Nothing aggravates the symptoms. Nothing relieves the symptoms. She has tried nothing for the symptoms. The treatment provided no relief.       Past Medical History:  Diagnosis Date  . ADHD (attention deficit hyperactivity disorder)   . Asthma   . Depression     Patient Active Problem List   Diagnosis Date Noted  . ADD (attention deficit disorder) 07/25/2013  . Anxiety and depression 07/25/2013    Past Surgical History:  Procedure Laterality Date  . NO PAST SURGERIES       OB History    Gravida  1   Para  1   Term  1   Preterm      AB      Living  1     SAB      IAB      Ectopic      Multiple  0   Live Births  1           Family History  Problem Relation Age of Onset  . Diabetes Mother   . Breast cancer Mother   . Colon polyps Mother   . Heart disease Father   . Bleeding Disorder Father   . Diabetes Father   . Neurofibromatosis Brother   . Learning disabilities Brother     Social History   Tobacco Use  . Smoking status: Former Smoker    Quit date: 07/16/2015    Years since quitting: 5.0  . Smokeless tobacco: Never Used  Vaping Use  . Vaping Use: Never used  Substance Use Topics  . Alcohol use: No    Alcohol/week: 0.0 standard drinks  . Drug use: No    Home Medications Prior to Admission medications   Medication Sig Start Date End Date Taking? Authorizing Provider  clindamycin (CLEOCIN) 300 MG capsule Take 1 capsule (300 mg total) by mouth 3 (three) times daily  for 10 days. 08/04/20 08/14/20 Yes Maurio Baize, DO  oxyCODONE (ROXICODONE) 5 MG immediate release tablet Take 1 tablet (5 mg total) by mouth every 6 (six) hours as needed for up to 10 days for breakthrough pain. 08/04/20 08/14/20 Yes Chantell Kunkler, DO  diclofenac (VOLTAREN) 75 MG EC tablet TAKE 1 TABLET(75 MG) BY MOUTH TWICE DAILY 07/30/20   Janeece Agee, NP  lisdexamfetamine (VYVANSE) 30 MG capsule Take 1 capsule (30 mg total) by mouth daily. 06/22/20   Janeece Agee, NP  methocarbamol (ROBAXIN) 500 MG tablet Take 1 tablet (500 mg total) by mouth 3 (three) times daily as needed for muscle spasms. 07/18/20   Janeece Agee, NP  venlafaxine XR (EFFEXOR XR) 37.5 MG 24 hr capsule Take 1 capsule (37.5 mg total) by mouth daily with breakfast. 07/18/20   Janeece Agee, NP    Allergies    Penicillins  Review of Systems   Review of Systems  Constitutional: Negative for fever.  HENT: Positive for dental problem. Negative for congestion, nosebleeds, postnasal drip, trouble swallowing and voice change.   Skin:  Positive for wound.  Neurological: Negative for numbness.    Physical Exam Updated Vital Signs BP 122/85 (BP Location: Right Arm)   Pulse (!) 102   Temp 98.9 F (37.2 C) (Oral)   Resp 17   SpO2 100%   Physical Exam Constitutional:      General: She is not in acute distress.    Appearance: She is not ill-appearing.  HENT:     Head:     Comments: Swelling and abrasion to the bottom lip, slight dental intrusion to the upper teeth but teeth are well intact to gumline, no trismus, no drooling    Mouth/Throat:     Mouth: Mucous membranes are moist.  Eyes:     Extraocular Movements: Extraocular movements intact.     Pupils: Pupils are equal, round, and reactive to light.  Neurological:     General: No focal deficit present.     Mental Status: She is alert.     ED Results / Procedures / Treatments   Labs (all labs ordered are listed, but only abnormal results are displayed) Labs  Reviewed - No data to display  EKG None  Radiology No results found.  Procedures Procedures   Medications Ordered in ED Medications  acetaminophen (TYLENOL) tablet 650 mg (has no administration in time range)    ED Course  I have reviewed the triage vital signs and the nursing notes.  Pertinent labs & imaging results that were available during my care of the patient were reviewed by me and considered in my medical decision making (see chart for details).    MDM Rules/Calculators/A&P                          DOLA LUNSFORD is here with mouth injury.  Patient has some intrusion of 2 of her upper teeth but they are well intact to the gumline.  Patient also with contusion and abrasion to the lower lip.  Her dog ran into her.  Will prescribe antibiotic.  Tetanus shot is up-to-date.  Given dentistry resources.  Written for narcotic pain medicine.  Given return precautions and discharged the ED in good condition.  This chart was dictated using voice recognition software.  Despite best efforts to proofread,  errors can occur which can change the documentation meaning.   Final Clinical Impression(s) / ED Diagnoses Final diagnoses:  Contusion of lip, initial encounter  Pain, dental    Rx / DC Orders ED Discharge Orders         Ordered    clindamycin (CLEOCIN) 300 MG capsule  3 times daily        08/04/20 1404    oxyCODONE (ROXICODONE) 5 MG immediate release tablet  Every 6 hours PRN        08/04/20 1404           Dalten Ambrosino, DO 08/04/20 1407

## 2020-08-04 NOTE — ED Triage Notes (Signed)
Pt states her dog ran into her, left lip injury with bleeding at gum line left middle tooth and 2nd tooth.

## 2020-08-07 NOTE — Telephone Encounter (Signed)
Please advise 

## 2020-08-08 MED ORDER — TRAMADOL HCL 50 MG PO TABS: 50.0000 mg | ORAL_TABLET | Freq: Three times a day (TID) | ORAL | 0 refills | Status: AC | PRN

## 2020-08-15 NOTE — Telephone Encounter (Signed)
Please advise 

## 2020-08-15 NOTE — Telephone Encounter (Signed)
This is another patient of Rich. Thanks

## 2020-08-16 ENCOUNTER — Other Ambulatory Visit: Payer: Self-pay | Admitting: Registered Nurse

## 2020-08-16 DIAGNOSIS — M545 Low back pain, unspecified: Secondary | ICD-10-CM

## 2020-08-16 MED ORDER — OXYCODONE HCL 5 MG PO TABS: 5.0000 mg | ORAL_TABLET | ORAL | 0 refills | Status: DC | PRN

## 2020-08-16 MED ORDER — CYCLOBENZAPRINE HCL 10 MG PO TABS: 10.0000 mg | ORAL_TABLET | Freq: Three times a day (TID) | ORAL | 0 refills | Status: DC | PRN

## 2020-08-16 NOTE — Telephone Encounter (Signed)
Please advise 

## 2020-08-20 NOTE — Telephone Encounter (Signed)
Is it safe to take cyclodenzaprine and diclofenac together ? Please Advise.

## 2020-08-27 ENCOUNTER — Other Ambulatory Visit: Payer: Self-pay | Admitting: Registered Nurse

## 2020-08-27 DIAGNOSIS — M545 Low back pain, unspecified: Secondary | ICD-10-CM

## 2020-08-28 ENCOUNTER — Other Ambulatory Visit: Payer: Self-pay

## 2020-08-28 ENCOUNTER — Ambulatory Visit: Payer: BC Managed Care – PPO | Admitting: Family Medicine

## 2020-08-28 DIAGNOSIS — M545 Low back pain, unspecified: Secondary | ICD-10-CM | POA: Diagnosis not present

## 2020-08-28 DIAGNOSIS — G8929 Other chronic pain: Secondary | ICD-10-CM

## 2020-08-28 NOTE — Progress Notes (Signed)
I saw and examined the patient with Dr. Marga Hoots and agree with assessment and plan as outlined.    Chronic LBP, tender near right SI joint.  Slight leg length discrepancy with left longer.  Will try PT.  If no relief, then MRI.

## 2020-08-28 NOTE — Progress Notes (Signed)
Office Visit Note   Patient: Meghan Nolan           Date of Birth: July 17, 1990           MRN: [id number] Visit Date: 08/28/2020 Requested by: Janeece Agee, NP [address] [place],  [place] [address] PCP: Janeece Agee, NP  Subjective: Chief Complaint  Patient presents with  . Lower Back - Pain    Pain in the right lower back/buttock region ("a catch"), radiating down the back of the thigh to the knee (burning pain down the leg and pinching pain posterior knee). This started 1 year ago, when her 51 y/o son injured himself when he fell into a bush. She had to lift him for a month afterward (he had sutures). She has seen a chiropractor Kathlene Cote) who suggested tx 3 x week. Her PCP referred her here for 2nd opinion.    HPI: 30yo F presenting to clinic for chronic lower back pain, radiating into bilateral posterior thighs. Patient states that her pain initially started 2 years ago, after her 3yo son was in a bike accident. During his recovery, she had to carry him frequently, in awkward positions (to avoid touching his stitches), and she thinks this is when her back pain became noticeable. Since this time, she says her pain has interfered with her running/working out, endorses pain when jumping on the trampoline. Pain is most significant when in a long car ride, when she will experience a burning sensation in the back of both of her thighs, with a 'clamp-like pain' at the back of her knees. She denies overt weakness, but does say that her feet fall asleep much faster than they used to, and she will occasionally get brief, shooting tingling pain in both of her feet.  No bowel/bladder dysfunction. States she has tried NSAIDs, Relaxors, tramadol, without improvement. She sought care with a chiropractor, who told her she would need frequent adjustments- 3x wk for 3 mo, which she didn't feel was sustainable. She has not yet tried PT.               ROS:   All other systems were reviewed and are  negative.  Objective: Vital Signs: There were no vitals taken for this visit.  Physical Exam:  General:  Alert and oriented, in no acute distress. Pulm:  Breathing unlabored. Psy:  Normal mood, congruent affect. Skin:  Lower back with no bruising, rashes, or erythema. Overlying skin intact.   BACK EXAMINATION: Normal Gait.  Normal Spinal curvature, without excessive lumbar lordosis, thoracic kyphosis, or scoliosis.  ROM: No Pain with forward flexion or extension. Able to achieve Toe-Touch. No pain with Rotation or Side-bending.  Palpation: Endorses tenderness over bilateral SI Joints, R>>L. Mild tenderness within gluteal musculature bilaterally. No midline tenderness, no deformity or step-offs. No paraspinal muscle tenderness.  Osteopathic Examination: R on L RST.  Strength: Hip flexion (L1), Hip Aduction (L2), Knee Extension (L3) are 5/5 Bilaterally Foot Inversion (L4), Dorsiflexion (L5), and Eversion (S1) 5/5 Bilaterally Sensation: Intact to light touch medial and lateral aspects of lower extremities, and lateral, dorsal, and medial aspects of foot.  Reflexes: Patellar (L4), and Ankle (S1) 2+ Bilaterally Special Tests:  FABER and Storke testing: No SI Joint Pain SLR: No radiation down Ipilateral or contralateral leg bilaterally Limb Length: Right ASIS appears inferior when compared to left ASIS.     Imaging: Previous XR from Chiropractor reviewed (On patient's phone), with concern for leg length discrepancy, shorter right.  Minimal disc height loss at L5-S1.   Assessment & Plan: 30yo F presenting to clinic with concerns of chronic lower back pain since lifting her 3yo son, with bilateral radicular symptoms upon prolonged sitting. No red flag symptoms today. - Has not yet tried physical therapy. Recommended she start with a formal PT evaluation. Patient states she is frustrated with this, as she is worried it will not offer improvement. - If no benefit with PT, she is instructed to  RTC in 4-6 weeks for consideration of MRI - Given shorter right leg, offered 3/16" heel lift. If uncomfortable, she is to discontinue wear.  - Patient expressed understanding with plan.      Procedures: No procedures performed        PMFS History: Patient Active Problem List   Diagnosis Date Noted  . ADD (attention deficit disorder) 07/25/2013  . Anxiety and depression 07/25/2013   Past Medical History:  Diagnosis Date  . ADHD (attention deficit hyperactivity disorder)   . Asthma   . Depression     Family History  Problem Relation Age of Onset  . Diabetes Mother   . Breast cancer Mother   . Colon polyps Mother   . Heart disease Father   . Bleeding Disorder Father   . Diabetes Father   . Neurofibromatosis Brother   . Learning disabilities Brother     Past Surgical History:  Procedure Laterality Date  . NO PAST SURGERIES     Social History   Occupational History  . Not on file  Tobacco Use  . Smoking status: Former Smoker    Quit date: 07/16/2015    Years since quitting: 5.1  . Smokeless tobacco: Never Used  Vaping Use  . Vaping Use: Never used  Substance and Sexual Activity  . Alcohol use: No    Alcohol/week: 0.0 standard drinks  . Drug use: No  . Sexual activity: Not Currently

## 2020-08-28 NOTE — Telephone Encounter (Signed)
Would you be willing to review and send alternative for the flexeril as pt is experiencing swollen feet.

## 2020-08-31 ENCOUNTER — Encounter: Payer: Self-pay | Admitting: Family Medicine

## 2020-08-31 ENCOUNTER — Other Ambulatory Visit: Payer: Self-pay | Admitting: Registered Nurse

## 2020-08-31 DIAGNOSIS — M545 Low back pain, unspecified: Secondary | ICD-10-CM

## 2020-08-31 NOTE — Telephone Encounter (Signed)
LFD 08/16/20 #30 with no refills LOV 07/18/20 NOV none

## 2020-09-03 ENCOUNTER — Other Ambulatory Visit: Payer: Self-pay | Admitting: Registered Nurse

## 2020-09-03 DIAGNOSIS — M545 Low back pain, unspecified: Secondary | ICD-10-CM

## 2020-09-03 MED ORDER — OXYCODONE HCL 5 MG PO TABS: 5.0000 mg | ORAL_TABLET | ORAL | 0 refills | Status: DC | PRN

## 2020-09-03 NOTE — Telephone Encounter (Signed)
Duplicate, unable to refuse. 

## 2020-09-06 ENCOUNTER — Other Ambulatory Visit: Payer: Self-pay

## 2020-09-06 ENCOUNTER — Ambulatory Visit (INDEPENDENT_AMBULATORY_CARE_PROVIDER_SITE_OTHER): Payer: BC Managed Care – PPO

## 2020-09-06 DIAGNOSIS — Z309 Encounter for contraceptive management, unspecified: Secondary | ICD-10-CM | POA: Diagnosis not present

## 2020-09-06 MED ORDER — MEDROXYPROGESTERONE ACETATE 150 MG/ML IM SUSP
150.0000 mg | Freq: Once | INTRAMUSCULAR | Status: AC
  Administered 2020-09-06: 150 mg via INTRAMUSCULAR

## 2020-09-06 NOTE — Progress Notes (Signed)
Meghan Nolan 30 yr old female presents to office today for 3 month birth control encounter per Janeece Agee, NP. Administered medroxyPROGESTERone IM right arm (patients preference) 150mg /mL. Patient tolerated well.

## 2020-09-17 ENCOUNTER — Other Ambulatory Visit: Payer: Self-pay | Admitting: Registered Nurse

## 2020-09-17 DIAGNOSIS — M545 Low back pain, unspecified: Secondary | ICD-10-CM

## 2020-09-17 DIAGNOSIS — F988 Other specified behavioral and emotional disorders with onset usually occurring in childhood and adolescence: Secondary | ICD-10-CM

## 2020-09-17 DIAGNOSIS — G8929 Other chronic pain: Secondary | ICD-10-CM

## 2020-09-17 DIAGNOSIS — M5441 Lumbago with sciatica, right side: Secondary | ICD-10-CM

## 2020-09-18 MED ORDER — LISDEXAMFETAMINE DIMESYLATE 30 MG PO CAPS: 30.0000 mg | ORAL_CAPSULE | Freq: Every day | ORAL | 0 refills | Status: DC

## 2020-09-18 MED ORDER — OXYCODONE HCL 5 MG PO TABS: 5.0000 mg | ORAL_TABLET | ORAL | 0 refills | Status: DC | PRN

## 2020-09-18 MED ORDER — DICLOFENAC SODIUM 75 MG PO TBEC: 75.0000 mg | DELAYED_RELEASE_TABLET | Freq: Two times a day (BID) | ORAL | 0 refills | Status: DC

## 2020-09-18 NOTE — Telephone Encounter (Signed)
Diclofenac LFD 07/30/20 #30 with no refills Oxycodone LFD 09/03/20 #30 with no refills VyVanse LFD 07/18/20 #90 with no refills LOV 07/18/20 NOV none

## 2020-09-18 NOTE — Telephone Encounter (Signed)
Duplicate, unable to refuse. 

## 2020-09-30 ENCOUNTER — Other Ambulatory Visit: Payer: Self-pay | Admitting: Registered Nurse

## 2020-09-30 DIAGNOSIS — G8929 Other chronic pain: Secondary | ICD-10-CM

## 2020-10-02 ENCOUNTER — Other Ambulatory Visit: Payer: Self-pay | Admitting: Registered Nurse

## 2020-10-02 DIAGNOSIS — M545 Low back pain, unspecified: Secondary | ICD-10-CM

## 2020-10-02 MED ORDER — OXYCODONE HCL 5 MG PO TABS
5.0000 mg | ORAL_TABLET | ORAL | 0 refills | Status: DC | PRN
Start: 2020-10-02 — End: 2020-10-16

## 2020-10-02 NOTE — Telephone Encounter (Signed)
LFD 09/18/20 #30 with no refills LOV 07/18/20 NOV none

## 2020-10-16 ENCOUNTER — Other Ambulatory Visit: Payer: Self-pay | Admitting: Registered Nurse

## 2020-10-16 DIAGNOSIS — M545 Low back pain, unspecified: Secondary | ICD-10-CM

## 2020-10-16 MED ORDER — OXYCODONE HCL 5 MG PO TABS: 5.0000 mg | ORAL_TABLET | ORAL | 0 refills | Status: DC | PRN

## 2020-10-16 NOTE — Telephone Encounter (Signed)
LFD 10/02/20 #30 with no refills LOV 07/18/20 NOV none

## 2020-11-01 ENCOUNTER — Other Ambulatory Visit: Payer: Self-pay | Admitting: Registered Nurse

## 2020-11-01 DIAGNOSIS — M545 Low back pain, unspecified: Secondary | ICD-10-CM

## 2020-11-01 MED ORDER — OXYCODONE HCL 5 MG PO TABS: 5.0000 mg | ORAL_TABLET | ORAL | 0 refills | Status: DC | PRN

## 2020-11-01 NOTE — Telephone Encounter (Signed)
LFD 10/16/20 #30 with no refills LOV 07/18/20 NOV none

## 2020-11-13 ENCOUNTER — Ambulatory Visit (HOSPITAL_BASED_OUTPATIENT_CLINIC_OR_DEPARTMENT_OTHER)
Admission: RE | Admit: 2020-11-13 | Discharge: 2020-11-13 | Disposition: A | Discharge status: Home / Self Care | Payer: BC Managed Care – PPO | Source: Ambulatory Visit | Attending: Registered Nurse | Admitting: Registered Nurse

## 2020-11-13 ENCOUNTER — Other Ambulatory Visit: Payer: Self-pay

## 2020-11-13 DIAGNOSIS — M545 Low back pain, unspecified: Secondary | ICD-10-CM | POA: Diagnosis not present

## 2020-11-14 ENCOUNTER — Other Ambulatory Visit: Payer: Self-pay | Admitting: Registered Nurse

## 2020-11-14 DIAGNOSIS — M545 Low back pain, unspecified: Secondary | ICD-10-CM

## 2020-11-14 NOTE — Telephone Encounter (Signed)
LFD 11/01/20 #30 with no refills LOV 07/18/20 NOV none

## 2020-11-15 ENCOUNTER — Ambulatory Visit (HOSPITAL_BASED_OUTPATIENT_CLINIC_OR_DEPARTMENT_OTHER)
Admission: RE | Admit: 2020-11-15 | Discharge: 2020-11-15 | Disposition: A | Discharge status: Home / Self Care | Payer: BC Managed Care – PPO | Source: Ambulatory Visit | Attending: Registered Nurse | Admitting: Registered Nurse

## 2020-11-15 ENCOUNTER — Telehealth: Payer: Self-pay

## 2020-11-15 ENCOUNTER — Other Ambulatory Visit: Payer: Self-pay

## 2020-11-15 DIAGNOSIS — M5442 Lumbago with sciatica, left side: Secondary | ICD-10-CM | POA: Diagnosis not present

## 2020-11-15 DIAGNOSIS — M25552 Pain in left hip: Secondary | ICD-10-CM | POA: Diagnosis not present

## 2020-11-15 DIAGNOSIS — G8929 Other chronic pain: Secondary | ICD-10-CM | POA: Diagnosis not present

## 2020-11-15 DIAGNOSIS — M5441 Lumbago with sciatica, right side: Secondary | ICD-10-CM | POA: Insufficient documentation

## 2020-11-15 DIAGNOSIS — M25551 Pain in right hip: Secondary | ICD-10-CM | POA: Diagnosis not present

## 2020-11-15 MED ORDER — OXYCODONE HCL 5 MG PO TABS: 5.0000 mg | ORAL_TABLET | ORAL | 0 refills | Status: DC | PRN

## 2020-11-15 NOTE — Telephone Encounter (Signed)
Pt called back she spoke with the hospital she will need the two missing x-rays.  For left and right hip.  Also she wants to follow up on RX request on oxyCODONE (ROXICODONE) 5 MG immediate release tablet [[id number]]   [id number] - [place], [place] - [address] AT [facility] [address]

## 2020-11-15 NOTE — Telephone Encounter (Signed)
Pt had some x rays done and the results come back she is not familiar with the verbiage and wants to make sure she does not need additional x-rays or was it done?    Pt call back [phone]

## 2020-11-20 ENCOUNTER — Encounter: Payer: Self-pay | Admitting: Registered Nurse

## 2020-11-21 ENCOUNTER — Ambulatory Visit (HOSPITAL_COMMUNITY)
Admission: RE | Admit: 2020-11-21 | Discharge: 2020-11-21 | Disposition: A | Discharge status: Home / Self Care | Payer: BC Managed Care – PPO | Source: Ambulatory Visit | Attending: Registered Nurse | Admitting: Registered Nurse

## 2020-11-21 ENCOUNTER — Other Ambulatory Visit: Payer: Self-pay

## 2020-11-21 ENCOUNTER — Other Ambulatory Visit: Payer: Self-pay | Admitting: Registered Nurse

## 2020-11-21 DIAGNOSIS — N281 Cyst of kidney, acquired: Secondary | ICD-10-CM | POA: Diagnosis not present

## 2020-11-21 DIAGNOSIS — R937 Abnormal findings on diagnostic imaging of other parts of musculoskeletal system: Secondary | ICD-10-CM

## 2020-11-21 DIAGNOSIS — D1809 Hemangioma of other sites: Secondary | ICD-10-CM | POA: Diagnosis not present

## 2020-11-21 DIAGNOSIS — M4186 Other forms of scoliosis, lumbar region: Secondary | ICD-10-CM | POA: Diagnosis not present

## 2020-11-21 MED ORDER — GADOBUTROL 1 MMOL/ML IV SOLN
7.0000 mL | Freq: Once | INTRAVENOUS | Status: AC | PRN
  Administered 2020-11-21: 7 mL via INTRAVENOUS

## 2020-11-22 ENCOUNTER — Other Ambulatory Visit: Payer: Self-pay | Admitting: Registered Nurse

## 2020-11-23 ENCOUNTER — Other Ambulatory Visit: Payer: Self-pay | Admitting: Registered Nurse

## 2020-11-23 DIAGNOSIS — G8929 Other chronic pain: Secondary | ICD-10-CM

## 2020-11-23 MED ORDER — METHOCARBAMOL 500 MG PO TABS: 500.0000 mg | ORAL_TABLET | Freq: Four times a day (QID) | ORAL | 1 refills | Status: DC | PRN

## 2020-11-23 MED ORDER — GABAPENTIN 600 MG PO TABS: 300.0000 mg | ORAL_TABLET | Freq: Three times a day (TID) | ORAL | 1 refills | Status: DC | PRN

## 2020-11-27 ENCOUNTER — Encounter: Payer: Self-pay | Admitting: Registered Nurse

## 2020-11-28 ENCOUNTER — Encounter: Payer: Self-pay | Admitting: Registered Nurse

## 2020-11-28 ENCOUNTER — Other Ambulatory Visit: Payer: Self-pay

## 2020-11-28 DIAGNOSIS — M545 Low back pain, unspecified: Secondary | ICD-10-CM

## 2020-11-28 MED ORDER — OXYCODONE HCL 5 MG PO TABS: 5.0000 mg | ORAL_TABLET | ORAL | 0 refills | Status: DC | PRN

## 2020-11-28 NOTE — Telephone Encounter (Signed)
Patient medication was sent to the pharmacy today. 

## 2020-11-28 NOTE — Telephone Encounter (Signed)
Good morning. I would to refill my prescription today due to you being off on Thursday's and Friday's. It conflicts with my pain relief while you are out these days. It's extremely hard to get a prescription refill while you are out. I tried submitting the request on the portal but it is not letting me. Thank you for your understanding  LFD 11/15/20 #30 with no refills LOV 07/18/20 NOV none

## 2020-11-28 NOTE — Telephone Encounter (Signed)
Pt called in stating that she is having very bad back pain, it is hot to the touch, her back is sweating and in pain. She states that the Gabapentin is not working.   Please call [phone] asap    Also see message below about refill request.

## 2020-11-30 ENCOUNTER — Telehealth (INDEPENDENT_AMBULATORY_CARE_PROVIDER_SITE_OTHER): Payer: BC Managed Care – PPO | Admitting: Registered Nurse

## 2020-11-30 ENCOUNTER — Encounter: Payer: Self-pay | Admitting: Registered Nurse

## 2020-11-30 DIAGNOSIS — R937 Abnormal findings on diagnostic imaging of other parts of musculoskeletal system: Secondary | ICD-10-CM

## 2020-11-30 DIAGNOSIS — M545 Low back pain, unspecified: Secondary | ICD-10-CM | POA: Diagnosis not present

## 2020-11-30 DIAGNOSIS — M5440 Lumbago with sciatica, unspecified side: Secondary | ICD-10-CM | POA: Diagnosis not present

## 2020-11-30 MED ORDER — OXYCODONE HCL 5 MG PO TABS: 5.0000 mg | ORAL_TABLET | ORAL | 0 refills | Status: DC | PRN

## 2020-11-30 NOTE — Progress Notes (Signed)
Telemedicine Encounter- SOAP NOTE Established Patient  This video encounter was conducted with the patient's (or proxy's) verbal consent via audio telecommunications: yes/no: Yes Patient was instructed to have this encounter in a suitably private space; and to only have persons present to whom they give permission to participate. In addition, patient identity was confirmed by use of name plus two identifiers (DOB and address).  I discussed the limitations, risks, security and privacy concerns of performing an evaluation and management service by telephone and the availability of in person appointments. I also discussed with the patient that there may be a patient responsible charge related to this service. The patient expressed understanding and agreed to proceed.  I spent a total of 32 minutes talking with the patient or their proxy.  Patient at home Provider in office  Participants: [name], NP and Merceda Elks  Chief Complaint  Patient presents with   Medication Reaction    Patient states she would like to discuss medications and some pain. Patient states that the Robaxin makes her feels different and have more pain.    Subjective   Meghan Nolan is a 30 y.o. established patient. Video visit today for ongoing pain  HPI Notes that robaxin gave AE -  Working well with oxycodone 10mg  po bid and gabapentin 100mg  po tid  Pain is only relieved with both medications When they wear off, 10/10 pain, hard to stand, needs help up stairs. Sciatic pain down L side. No new symptoms or concerns No trauma or acute injury  Patient Active Problem List   Diagnosis Date Noted   ADD (attention deficit disorder) 07/25/2013   Anxiety and depression 07/25/2013    Past Medical History:  Diagnosis Date   ADHD (attention deficit hyperactivity disorder)    Asthma    Depression     Current Outpatient Medications  Medication Sig Dispense Refill   gabapentin (NEURONTIN) 600 MG tablet  Take 0.5 tablets (300 mg total) by mouth 3 (three) times daily as needed (pain in legs/feet.). 120 tablet 1   lisdexamfetamine (VYVANSE) 30 MG capsule Take 1 capsule (30 mg total) by mouth daily. 90 capsule 0   [START ON 12/07/2020] oxyCODONE (ROXICODONE) 5 MG immediate release tablet Take 1 tablet (5 mg total) by mouth every 4 (four) hours as needed for severe pain. 30 tablet 0   No current facility-administered medications for this visit.    Allergies  Allergen Reactions   Penicillins Shortness Of Breath    Has patient had a PCN reaction causing immediate rash, facial/tongue/throat swelling, SOB or lightheadedness with hypotension: Yes Has patient had a PCN reaction causing severe rash involving mucus membranes or skin necrosis: Yes Has patient had a PCN reaction that required hospitalization Yes Has patient had a PCN reaction occurring within the last 10 years: No If all of the above answers are "NO", then may proceed with Cephalosporin use.     Social History   Socioeconomic History   Marital status: Significant Other    Spouse name: Not on file   Number of children: 1   Years of education: Not on file   Highest education level: Not on file  Occupational History   Not on file  Tobacco Use   Smoking status: Former    Types: Cigarettes    Quit date: 07/16/2015    Years since quitting: 5.3   Smokeless tobacco: Never  Vaping Use   Vaping Use: Never used  Substance and Sexual Activity  Alcohol use: No    Alcohol/week: 0.0 standard drinks   Drug use: No   Sexual activity: Not Currently  Other Topics Concern   Not on file  Social History Narrative   Not on file   Social Determinants of Health   Financial Resource Strain: Not on file  Food Insecurity: Not on file  Transportation Needs: Not on file  Physical Activity: Not on file  Stress: Not on file  Social Connections: Not on file  Intimate Partner Violence: Not on file    Review of Systems  Constitutional:  Negative.   HENT: Negative.    Eyes: Negative.   Respiratory: Negative.    Cardiovascular: Negative.   Gastrointestinal: Negative.   Genitourinary: Negative.   Musculoskeletal:  Positive for back pain. Negative for falls, joint pain, myalgias and neck pain.  Skin: Negative.   Neurological: Negative.   Endo/Heme/Allergies: Negative.   Psychiatric/Behavioral: Negative.    All other systems reviewed and are negative.  Objective   Vitals as reported by the patient: There were no vitals filed for this visit.  Sharen was seen today for medication reaction.  Diagnoses and all orders for this visit:  Acute low back pain with sciatica, sciatica laterality unspecified, unspecified back pain laterality -     Ambulatory referral to Neurosurgery -     oxyCODONE (ROXICODONE) 5 MG immediate release tablet; Take 1 tablet (5 mg total) by mouth every 4 (four) hours as needed for severe pain. -     Ambulatory referral to Physical Therapy -     Ambulatory referral to Orthopedic Surgery  Acute bilateral low back pain without sciatica -     oxyCODONE (ROXICODONE) 5 MG immediate release tablet; Take 1 tablet (5 mg total) by mouth every 4 (four) hours as needed for severe pain.  Abnormal x-ray of lumbar spine -     Ambulatory referral to Neurosurgery -     oxyCODONE (ROXICODONE) 5 MG immediate release tablet; Take 1 tablet (5 mg total) by mouth every 4 (four) hours as needed for severe pain. -     Ambulatory referral to Physical Therapy -     Ambulatory referral to Orthopedic Surgery   PLAN Continue current pain regimen Urgent referral to neurosurg and ortho Nonpharm encouraged Patient encouraged to call clinic with any questions, comments, or concerns.  I discussed the assessment and treatment plan with the patient. The patient was provided an opportunity to ask questions and all were answered. The patient agreed with the plan and demonstrated an understanding of the instructions.   The  patient was advised to call back or seek an in-person evaluation if the symptoms worsen or if the condition fails to improve as anticipated.  I provided 32 minutes of non-face-to-face time during this encounter.  Janeece Agee, NP

## 2020-12-02 ENCOUNTER — Encounter (HOSPITAL_BASED_OUTPATIENT_CLINIC_OR_DEPARTMENT_OTHER): Payer: Self-pay | Admitting: Obstetrics and Gynecology

## 2020-12-02 ENCOUNTER — Other Ambulatory Visit: Payer: Self-pay

## 2020-12-02 DIAGNOSIS — Z87891 Personal history of nicotine dependence: Secondary | ICD-10-CM | POA: Diagnosis not present

## 2020-12-02 DIAGNOSIS — J45909 Unspecified asthma, uncomplicated: Secondary | ICD-10-CM | POA: Insufficient documentation

## 2020-12-02 DIAGNOSIS — S80212A Abrasion, left knee, initial encounter: Secondary | ICD-10-CM | POA: Diagnosis not present
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
## 2020-12-03 ENCOUNTER — Emergency Department (HOSPITAL_BASED_OUTPATIENT_CLINIC_OR_DEPARTMENT_OTHER)
Admission: EM | Admit: 2020-12-03 | Discharge: 2020-12-03 | Disposition: A | Discharge status: Home / Self Care | Payer: BC Managed Care – PPO | Attending: Emergency Medicine | Admitting: Emergency Medicine

## 2020-12-03 ENCOUNTER — Ambulatory Visit (INDEPENDENT_AMBULATORY_CARE_PROVIDER_SITE_OTHER): Payer: BC Managed Care – PPO | Admitting: Registered Nurse

## 2020-12-03 DIAGNOSIS — S80212A Abrasion, left knee, initial encounter: Secondary | ICD-10-CM | POA: Diagnosis not present

## 2020-12-03 DIAGNOSIS — Z309 Encounter for contraceptive management, unspecified: Secondary | ICD-10-CM

## 2020-12-03 MED ORDER — MEDROXYPROGESTERONE ACETATE 150 MG/ML IM SUSY
PREFILLED_SYRINGE | Freq: Once | INTRAMUSCULAR | Status: AC
  Administered 2020-12-03: 150 mL via INTRAMUSCULAR

## 2020-12-03 NOTE — Progress Notes (Addendum)
Meghan Nolan 30 yr old female presents to office today for 3 month birth control encounter per Janeece Agee, NP. Administered medroxyPROGESTERone IM left arm (patients preference) 150mg /mL. Patient tolerated well and is due to return 11/7-11/21/2022    Reviewed personally by myself. Ok with plan. Pt to return at above dates - she is consistently on time for these injections. No Aes or contraindications.  02-12-1992, NP

## 2020-12-03 NOTE — Discharge Instructions (Addendum)
You were seen today for an abrasion/laceration of the nail left knee.  This does not appear significantly deep.  It is not bleeding.  This will likely heal without any difficulty.  Apply antibiotic ointment and keep a dressing on the knee over the next 3 to 5 days.  Monitor for signs and symptoms of infection.
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
## 2020-12-03 NOTE — ED Notes (Signed)
Pt verbalizes understanding of discharge instructions. Opportunity for questioning and answers were provided. Armand removed by staff, pt discharged from ED to home. Educated on wound care. 

## 2020-12-04 ENCOUNTER — Encounter: Payer: Self-pay | Admitting: Registered Nurse

## 2020-12-04 DIAGNOSIS — G5701 Lesion of sciatic nerve, right lower limb: Secondary | ICD-10-CM | POA: Diagnosis not present

## 2020-12-04 DIAGNOSIS — M5416 Radiculopathy, lumbar region: Secondary | ICD-10-CM | POA: Diagnosis not present

## 2020-12-04 DIAGNOSIS — R030 Elevated blood-pressure reading, without diagnosis of hypertension: Secondary | ICD-10-CM | POA: Diagnosis not present
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
## 2020-12-05 ENCOUNTER — Other Ambulatory Visit: Payer: Self-pay | Admitting: Registered Nurse

## 2020-12-05 ENCOUNTER — Telehealth: Payer: Self-pay | Admitting: *Deleted

## 2020-12-05 ENCOUNTER — Telehealth: Payer: Self-pay

## 2020-12-05 DIAGNOSIS — R937 Abnormal findings on diagnostic imaging of other parts of musculoskeletal system: Secondary | ICD-10-CM

## 2020-12-05 DIAGNOSIS — M545 Low back pain, unspecified: Secondary | ICD-10-CM

## 2020-12-05 DIAGNOSIS — M5440 Lumbago with sciatica, unspecified side: Secondary | ICD-10-CM

## 2020-12-05 MED ORDER — OXYCODONE HCL 5 MG PO TABS: 5.0000 mg | ORAL_TABLET | ORAL | 0 refills | Status: DC | PRN

## 2020-12-05 NOTE — Telephone Encounter (Signed)
Pt returning your call   Call back number is [phone]

## 2020-12-05 NOTE — Telephone Encounter (Signed)
Patient's mother called our office upset, stating that patient had been trying to contact our office and there was no response. MyChart message was sent to [place] and there had not been an answer to address her pain. Mother stated " I understand that Gerlene Burdock sees patients during the day but there is no way he could not quickly respond to her daughter in between his patients." She also stated that her daughter had been accused of being a "druggie" and that there was a lack of empathy for the pain she was experiencing. Mother states that patient has not gotten any help and no one has called her to set up any appointments with her.   I relayed our MyChart message policy to mother and let her know that in the future urgent matters should be called in. Mother states that her daughter had called in and said "you all turned her phone calls into MyChart messages." Mother states that her daughter called crying to our office and someone let her know that we only took MyChart messages, emphasizing that there was a lack of empathy on our part. I asked if she knew who she had spoken with she stated she did not.   Mother stated that her daughter was beyond the care that we were able to provide her at our office and stated that she needed a [facility] and [place] referral   I let mother know that the request would be followed up on and I asked her to encourage her daughter to call our office as she is not listed on the DPR and I could only listen to her without a response. Mother verbalized understanding our policies and would encourage her daughter to call me.

## 2020-12-06 DIAGNOSIS — M25551 Pain in right hip: Secondary | ICD-10-CM | POA: Diagnosis not present

## 2020-12-12 ENCOUNTER — Other Ambulatory Visit: Payer: Self-pay | Admitting: Registered Nurse

## 2020-12-12 DIAGNOSIS — F988 Other specified behavioral and emotional disorders with onset usually occurring in childhood and adolescence: Secondary | ICD-10-CM

## 2020-12-14 DIAGNOSIS — M25559 Pain in unspecified hip: Secondary | ICD-10-CM | POA: Diagnosis not present

## 2020-12-19 ENCOUNTER — Other Ambulatory Visit: Payer: Self-pay | Admitting: Registered Nurse

## 2020-12-19 ENCOUNTER — Encounter: Payer: Self-pay | Admitting: Registered Nurse

## 2020-12-19 DIAGNOSIS — R937 Abnormal findings on diagnostic imaging of other parts of musculoskeletal system: Secondary | ICD-10-CM

## 2020-12-19 DIAGNOSIS — F988 Other specified behavioral and emotional disorders with onset usually occurring in childhood and adolescence: Secondary | ICD-10-CM

## 2020-12-19 DIAGNOSIS — G8929 Other chronic pain: Secondary | ICD-10-CM

## 2020-12-19 DIAGNOSIS — M5441 Lumbago with sciatica, right side: Secondary | ICD-10-CM

## 2020-12-19 DIAGNOSIS — M545 Low back pain, unspecified: Secondary | ICD-10-CM

## 2020-12-19 DIAGNOSIS — M5440 Lumbago with sciatica, unspecified side: Secondary | ICD-10-CM

## 2020-12-19 MED ORDER — GABAPENTIN 600 MG PO TABS: 300.0000 mg | ORAL_TABLET | Freq: Three times a day (TID) | ORAL | 1 refills | Status: DC | PRN

## 2020-12-19 MED ORDER — OXYCODONE HCL 5 MG PO TABS: 5.0000 mg | ORAL_TABLET | ORAL | 0 refills | Status: DC | PRN

## 2020-12-19 MED ORDER — LISDEXAMFETAMINE DIMESYLATE 30 MG PO CAPS: 30.0000 mg | ORAL_CAPSULE | Freq: Every day | ORAL | 0 refills | Status: DC

## 2020-12-27 DIAGNOSIS — M25551 Pain in right hip: Secondary | ICD-10-CM | POA: Diagnosis not present

## 2021-01-08 DIAGNOSIS — M5416 Radiculopathy, lumbar region: Secondary | ICD-10-CM | POA: Diagnosis not present

## 2021-01-15 ENCOUNTER — Other Ambulatory Visit: Payer: Self-pay | Admitting: Registered Nurse

## 2021-01-15 DIAGNOSIS — M5440 Lumbago with sciatica, unspecified side: Secondary | ICD-10-CM

## 2021-01-15 DIAGNOSIS — R937 Abnormal findings on diagnostic imaging of other parts of musculoskeletal system: Secondary | ICD-10-CM

## 2021-01-15 DIAGNOSIS — M545 Low back pain, unspecified: Secondary | ICD-10-CM

## 2021-01-16 MED ORDER — OXYCODONE HCL 5 MG PO TABS: 5.0000 mg | ORAL_TABLET | ORAL | 0 refills | Status: DC | PRN

## 2021-01-16 NOTE — Telephone Encounter (Signed)
Seen,  [name]

## 2021-01-16 NOTE — Telephone Encounter (Signed)
Patient is requesting a refill of the following medications: Requested Prescriptions   Pending Prescriptions Disp Refills   oxyCODONE (ROXICODONE) 5 MG immediate release tablet 60 tablet 0    Sig: Take 1-2 tablets (5-10 mg total) by mouth every 4 (four) hours as needed for severe pain.    Date of patient request:01/15/2021 Last office visit: 07/18/2020 Date of last refill: 12/19/2020 Last refill amount: 60 tablets  Follow up time period per chart:

## 2021-01-16 NOTE — Telephone Encounter (Signed)
Seen  Rich

## 2021-01-16 NOTE — Telephone Encounter (Signed)
Seen  Rich

## 2021-01-17 ENCOUNTER — Other Ambulatory Visit: Payer: Self-pay | Admitting: Registered Nurse

## 2021-01-17 DIAGNOSIS — M5440 Lumbago with sciatica, unspecified side: Secondary | ICD-10-CM

## 2021-01-17 DIAGNOSIS — R937 Abnormal findings on diagnostic imaging of other parts of musculoskeletal system: Secondary | ICD-10-CM

## 2021-01-17 DIAGNOSIS — M545 Low back pain, unspecified: Secondary | ICD-10-CM

## 2021-01-17 MED ORDER — OXYCODONE HCL 5 MG PO TABS: 5.0000 mg | ORAL_TABLET | ORAL | 0 refills | Status: DC | PRN

## 2021-01-17 MED ORDER — PREGABALIN 75 MG PO CAPS: 75.0000 mg | ORAL_CAPSULE | Freq: Two times a day (BID) | ORAL | 0 refills | Status: DC

## 2021-01-21 DIAGNOSIS — M5416 Radiculopathy, lumbar region: Secondary | ICD-10-CM | POA: Diagnosis not present

## 2021-01-24 NOTE — Telephone Encounter (Signed)
Acknowledged  Thanks  [employer]

## 2021-01-29 DIAGNOSIS — M5416 Radiculopathy, lumbar region: Secondary | ICD-10-CM | POA: Diagnosis not present

## 2021-02-11 ENCOUNTER — Encounter: Payer: Self-pay | Admitting: Registered Nurse

## 2021-02-11 ENCOUNTER — Other Ambulatory Visit: Payer: Self-pay | Admitting: Registered Nurse

## 2021-02-11 DIAGNOSIS — M545 Low back pain, unspecified: Secondary | ICD-10-CM

## 2021-02-11 DIAGNOSIS — R937 Abnormal findings on diagnostic imaging of other parts of musculoskeletal system: Secondary | ICD-10-CM

## 2021-02-11 DIAGNOSIS — M5440 Lumbago with sciatica, unspecified side: Secondary | ICD-10-CM

## 2021-02-11 MED ORDER — PREGABALIN 75 MG PO CAPS: 75.0000 mg | ORAL_CAPSULE | Freq: Two times a day (BID) | ORAL | 0 refills | Status: DC

## 2021-02-11 MED ORDER — OXYCODONE HCL 5 MG PO TABS: 5.0000 mg | ORAL_TABLET | ORAL | 0 refills | Status: DC | PRN

## 2021-02-11 NOTE — Telephone Encounter (Signed)
Patient is requesting a refill of the following medications: Requested Prescriptions   Pending Prescriptions Disp Refills   oxyCODONE (ROXICODONE) 5 MG immediate release tablet 60 tablet 0    Sig: Take 1-2 tablets (5-10 mg total) by mouth every 4 (four) hours as needed for severe pain.   pregabalin (LYRICA) 75 MG capsule 90 capsule 0    Sig: Take 1 capsule (75 mg total) by mouth 2 (two) times daily.    Date of patient request: 02/11/2021 Last office visit: 11/30/2020 Date of last refill: 01/17/2021 Last refill amount: 60 tablets  Follow up time period per chart: none

## 2021-02-14 DIAGNOSIS — M5416 Radiculopathy, lumbar region: Secondary | ICD-10-CM | POA: Diagnosis not present

## 2021-03-01 ENCOUNTER — Ambulatory Visit (INDEPENDENT_AMBULATORY_CARE_PROVIDER_SITE_OTHER): Payer: BC Managed Care – PPO | Admitting: Registered Nurse

## 2021-03-01 DIAGNOSIS — Z309 Encounter for contraceptive management, unspecified: Secondary | ICD-10-CM

## 2021-03-01 MED ORDER — MEDROXYPROGESTERONE ACETATE 150 MG/ML IM SUSP
150.0000 mg | Freq: Once | INTRAMUSCULAR | Status: AC
Start: 2021-03-01 — End: 2021-03-01
  Administered 2021-03-01: 150 mg via INTRAMUSCULAR

## 2021-03-04 DIAGNOSIS — M5416 Radiculopathy, lumbar region: Secondary | ICD-10-CM | POA: Diagnosis not present

## 2021-03-05 NOTE — Progress Notes (Signed)
Pt was given Depo provera injection within her window, notes no concerns on last injection. Administered to Rt ventrogluteal area

## 2021-03-08 ENCOUNTER — Other Ambulatory Visit: Payer: Self-pay | Admitting: Registered Nurse

## 2021-03-08 DIAGNOSIS — F988 Other specified behavioral and emotional disorders with onset usually occurring in childhood and adolescence: Secondary | ICD-10-CM

## 2021-03-08 DIAGNOSIS — M545 Low back pain, unspecified: Secondary | ICD-10-CM

## 2021-03-08 DIAGNOSIS — M5440 Lumbago with sciatica, unspecified side: Secondary | ICD-10-CM

## 2021-03-08 DIAGNOSIS — R937 Abnormal findings on diagnostic imaging of other parts of musculoskeletal system: Secondary | ICD-10-CM

## 2021-03-11 MED ORDER — OXYCODONE HCL 5 MG PO TABS: 5.0000 mg | ORAL_TABLET | ORAL | 0 refills | Status: DC | PRN

## 2021-03-11 MED ORDER — LISDEXAMFETAMINE DIMESYLATE 30 MG PO CAPS: 30.0000 mg | ORAL_CAPSULE | Freq: Every day | ORAL | 0 refills | Status: DC

## 2021-03-20 DIAGNOSIS — M5416 Radiculopathy, lumbar region: Secondary | ICD-10-CM | POA: Diagnosis not present

## 2021-04-01 ENCOUNTER — Other Ambulatory Visit: Payer: Self-pay | Admitting: Registered Nurse

## 2021-04-01 DIAGNOSIS — M545 Low back pain, unspecified: Secondary | ICD-10-CM

## 2021-04-01 DIAGNOSIS — M5440 Lumbago with sciatica, unspecified side: Secondary | ICD-10-CM

## 2021-04-01 DIAGNOSIS — R937 Abnormal findings on diagnostic imaging of other parts of musculoskeletal system: Secondary | ICD-10-CM

## 2021-04-01 MED ORDER — PREGABALIN 75 MG PO CAPS: 75.0000 mg | ORAL_CAPSULE | Freq: Two times a day (BID) | ORAL | 0 refills | Status: AC

## 2021-04-01 MED ORDER — OXYCODONE HCL 5 MG PO TABS: 5.0000 mg | ORAL_TABLET | ORAL | 0 refills | Status: DC | PRN

## 2021-04-24 ENCOUNTER — Encounter: Payer: Self-pay | Admitting: Family Medicine

## 2021-04-24 ENCOUNTER — Other Ambulatory Visit: Payer: Self-pay

## 2021-04-24 ENCOUNTER — Encounter: Payer: Self-pay | Admitting: Registered Nurse

## 2021-04-24 ENCOUNTER — Telehealth: Payer: BC Managed Care – PPO | Admitting: Family Medicine

## 2021-04-24 DIAGNOSIS — K529 Noninfective gastroenteritis and colitis, unspecified: Secondary | ICD-10-CM

## 2021-04-24 MED ORDER — ONDANSETRON HCL 8 MG PO TABS: 8.0000 mg | ORAL_TABLET | Freq: Three times a day (TID) | ORAL | 0 refills | Status: DC | PRN

## 2021-04-24 NOTE — Assessment & Plan Note (Signed)
With diarrhea and nausea (some rhinorrhea that may or may not be related) and negative covid test  Most likely viral   Disc prevention of dehydration with sips of fluids Rev s/s of dehydration and ER precautions Generic zofran px for nausea  If not improved (diarrhea/nausea) but Friday consider labs and GI stool profile  Instructed her to reach out at that point  Handout given

## 2021-04-24 NOTE — Patient Instructions (Signed)
Sip fluids (slowly) to gradually re hydrate  Try the generic zofran for nausea  If your symptoms do not improve by Friday please call  If severe or you feel dehydrated (dizzy, dry mouth) or if you have constant abdominal pain please go to the ER  Watch your temperature  If cough or upper respiratory symptoms worsen let us know also  When feeling better -advance diet very slowly  BRAT diet (bananas, rice, apple sauce and toast) are good options

## 2021-04-24 NOTE — Progress Notes (Signed)
Virtual Visit via Video Note  I connected with Meghan Nolan on 04/24/21 at 12:00 PM EST by a video enabled telemedicine application and verified that I am speaking with the correct person using two identifiers.  Location: Patient: home Provider: office   I discussed the limitations of evaluation and management by telemedicine and the availability of in person appointments. The patient expressed understanding and agreed to proceed.  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 presents with diarrhea   Covid test this am was negative   Symptoms started Sunday night (unsure what she ate prior) - tends to eat nuts and m and Ms  Stomach hurt  Monday constant diarrhea   Tuesday no appetite , worse diarrhea , that night watery stool  Some hot flashes and some chills  No fever however Very tired   Head is burning (feeling) Had a headache this am, that went away  Gaggling and dry heaves   Ate a few bites of BLT last night and it gave her diarrhea  She has had some sneezing for the past 3 days -more than usual  Runny/not stuffy  Lingering cough the past few weeks   Still having abdominal cramps  Abdomen is not tender  Makes a lot of noise   No diarrhea since 10 am  Sipping propel   Otc Propel fitness water  Used some medicine from her mom Oscillocococcinum  (? Probiotic/ homeopathic and helps with flu like symptoms) This helped diarrhea  Cannot take tylenol-it causes nausea  Pepto does not work   Took ibuprofen this am- made her nauseated    There is a stomach bug going around-others at work have been sick (but not at home)   Patient Active Problem List   Diagnosis Date Noted   Acute gastroenteritis 04/24/2021   ADD (attention deficit disorder) 07/25/2013   Anxiety and depression 07/25/2013   Past Medical History:  Diagnosis Date   ADHD (attention deficit hyperactivity  disorder)    Asthma    Depression    Past Surgical History:  Procedure Laterality Date   NO PAST SURGERIES     Social History   Tobacco Use   Smoking status: Former    Types: Cigarettes    Quit date: 07/16/2015    Years since quitting: 5.7   Smokeless tobacco: Never  Vaping Use   Vaping Use: Never used  Substance Use Topics   Alcohol use: No    Alcohol/week: 0.0 standard drinks   Drug use: No   Family History  Problem Relation Age of Onset   Diabetes Mother    Breast cancer Mother    Colon polyps Mother    Heart disease Father    Bleeding Disorder Father    Diabetes Father    Neurofibromatosis Brother    Learning disabilities Brother    Allergies  Allergen Reactions   Penicillins Shortness Of Breath    Has patient had a PCN reaction causing immediate rash, facial/tongue/throat swelling, SOB or lightheadedness with hypotension: Yes Has patient had a PCN reaction causing severe rash involving mucus membranes or skin necrosis: Yes Has patient had a PCN reaction that required hospitalization Yes Has patient had a PCN reaction occurring within the last 10 years: No If all of the above answers are "NO", then may proceed with Cephalosporin use.    Current Outpatient Medications on File Prior to Visit  Medication Sig Dispense Refill   lisdexamfetamine (VYVANSE) 30 MG capsule Take 1 capsule (30 mg total) by mouth daily. 90 capsule 0   oxyCODONE (ROXICODONE) 5 MG immediate release tablet Take 1-2 tablets (5-10 mg total) by mouth every 4 (four) hours as needed for severe pain. 60 tablet 0   pregabalin (LYRICA) 75 MG capsule Take 1 capsule (75 mg total) by mouth 2 (two) times daily. 90 capsule 0   No current facility-administered medications on file prior to visit.   Review of Systems  Constitutional:  Positive for chills and malaise/fatigue. Negative for fever.  HENT:  Negative for congestion, ear pain, sinus pain and sore throat.        Rhinorrhea  Eyes:  Negative for  blurred vision, discharge and redness.  Respiratory:  Negative for cough, sputum production, shortness of breath and stridor.   Cardiovascular:  Negative for chest pain, palpitations and leg swelling.  Gastrointestinal:  Positive for abdominal pain, diarrhea and nausea. Negative for blood in stool, constipation, heartburn, melena and vomiting.  Genitourinary:  Negative for dysuria.  Musculoskeletal:  Negative for myalgias.  Skin:  Negative for rash.  Neurological:  Negative for dizziness and headaches.   Observations/Objective: Patient appears well, in no distress (does seem fatigued) Weight is baseline  No facial swelling or asymmetry Normal voice-not hoarse and no slurred speech No obvious tremor or mobility impairment Moving neck and UEs normally Able to hear the call well  Notes no tenderness when palpating her abdomen  No cough or shortness of breath during interview  Talkative and mentally sharp with no cognitive changes No skin changes on face or neck , no rash or pallor Affect is normal    Assessment and Plan: Problem List Items Addressed This Visit       Digestive   Acute gastroenteritis    With diarrhea and nausea (some rhinorrhea that may or may not be related) and negative covid test  Most likely viral   Disc prevention of dehydration with sips of fluids Rev s/s of dehydration and ER precautions Generic zofran px for nausea  If not improved (diarrhea/nausea) but Friday consider labs and GI stool profile  Instructed her to reach out at that point  Handout given        Follow Up Instructions: Sip fluids (slowly) to gradually re hydrate  Try the generic zofran for nausea  If your symptoms do not improve by Friday please call  If severe or you feel dehydrated (dizzy, dry mouth) or if you have constant abdominal pain please go to the ER  Watch your temperature  If cough or upper respiratory symptoms worsen let us know also  When feeling better -advance diet  very slowly  BRAT diet (bananas, rice, apple sauce and toast) are good options   I discussed the assessment and treatment plan with the patient. The patient was provided an opportunity to ask questions and all were answered. The patient agreed with the plan and demonstrated an understanding of the instructions.   The patient was advised to call back or seek an in-person evaluation if the symptoms worsen or if the condition fails to improve as anticipated.    Roxy Manns, MD

## 2021-04-25 ENCOUNTER — Other Ambulatory Visit: Payer: Self-pay | Admitting: Registered Nurse

## 2021-04-25 DIAGNOSIS — M5440 Lumbago with sciatica, unspecified side: Secondary | ICD-10-CM

## 2021-04-25 DIAGNOSIS — R937 Abnormal findings on diagnostic imaging of other parts of musculoskeletal system: Secondary | ICD-10-CM

## 2021-04-25 DIAGNOSIS — M545 Low back pain, unspecified: Secondary | ICD-10-CM

## 2021-04-25 NOTE — Telephone Encounter (Signed)
Patient is requesting a refill of the following medications: Requested Prescriptions   Pending Prescriptions Disp Refills   oxyCODONE (ROXICODONE) 5 MG immediate release tablet 60 tablet 0    Sig: Take 1-2 tablets (5-10 mg total) by mouth every 4 (four) hours as needed for severe pain.    Date of patient request: 04/25/21 Last office visit: 11/30/20 Date of last refill: 04/01/21 Last refill amount: 60

## 2021-04-26 MED ORDER — OXYCODONE HCL 5 MG PO TABS: 5.0000 mg | ORAL_TABLET | ORAL | 0 refills | Status: DC | PRN

## 2021-05-21 ENCOUNTER — Other Ambulatory Visit: Payer: Self-pay | Admitting: Registered Nurse

## 2021-05-21 DIAGNOSIS — F988 Other specified behavioral and emotional disorders with onset usually occurring in childhood and adolescence: Secondary | ICD-10-CM

## 2021-05-21 DIAGNOSIS — M5440 Lumbago with sciatica, unspecified side: Secondary | ICD-10-CM

## 2021-05-21 DIAGNOSIS — M545 Low back pain, unspecified: Secondary | ICD-10-CM

## 2021-05-21 DIAGNOSIS — R937 Abnormal findings on diagnostic imaging of other parts of musculoskeletal system: Secondary | ICD-10-CM

## 2021-05-23 NOTE — Telephone Encounter (Signed)
Patient is requesting a refill of the following medications: Requested Prescriptions   Pending Prescriptions Disp Refills   lisdexamfetamine (VYVANSE) 30 MG capsule 90 capsule 0    Sig: Take 1 capsule (30 mg total) by mouth daily.   oxyCODONE (ROXICODONE) 5 MG immediate release tablet 60 tablet 0    Sig: Take 1-2 tablets (5-10 mg total) by mouth every 4 (four) hours as needed for severe pain.    Date of patient request: 05/21/2021 Last office visit: 11/2020 Date of last refill: 03/11/2021 Last refill amount: 90 capsules Follow up time period per chart: n/a

## 2021-05-27 ENCOUNTER — Other Ambulatory Visit: Payer: Self-pay | Admitting: Registered Nurse

## 2021-05-27 DIAGNOSIS — Z3042 Encounter for surveillance of injectable contraceptive: Secondary | ICD-10-CM | POA: Diagnosis not present

## 2021-05-27 DIAGNOSIS — M5440 Lumbago with sciatica, unspecified side: Secondary | ICD-10-CM

## 2021-05-27 DIAGNOSIS — M545 Low back pain, unspecified: Secondary | ICD-10-CM

## 2021-05-27 DIAGNOSIS — F988 Other specified behavioral and emotional disorders with onset usually occurring in childhood and adolescence: Secondary | ICD-10-CM

## 2021-05-27 DIAGNOSIS — R937 Abnormal findings on diagnostic imaging of other parts of musculoskeletal system: Secondary | ICD-10-CM

## 2021-05-27 MED ORDER — OXYCODONE HCL 5 MG PO TABS: 5.0000 mg | ORAL_TABLET | ORAL | 0 refills | Status: DC | PRN

## 2021-05-27 MED ORDER — LISDEXAMFETAMINE DIMESYLATE 30 MG PO CAPS: 30.0000 mg | ORAL_CAPSULE | Freq: Every day | ORAL | 0 refills | Status: AC

## 2021-06-21 ENCOUNTER — Other Ambulatory Visit: Payer: Self-pay | Admitting: Registered Nurse

## 2021-06-21 DIAGNOSIS — F988 Other specified behavioral and emotional disorders with onset usually occurring in childhood and adolescence: Secondary | ICD-10-CM

## 2021-06-25 ENCOUNTER — Encounter: Payer: Self-pay | Admitting: Registered Nurse

## 2021-06-25 NOTE — Telephone Encounter (Signed)
Spoke to the patient and medication was clarified  ?

## 2021-06-25 NOTE — Telephone Encounter (Signed)
Brooke called to discuss prescription with pt LM ? ?Verified with pharmacist that the 90 day rx sent in 05/27/21 was filled today 06/25/21 and pt will not be eligible for refill until June  ?

## 2021-06-26 ENCOUNTER — Other Ambulatory Visit: Payer: Self-pay | Admitting: Registered Nurse

## 2021-06-26 DIAGNOSIS — M545 Low back pain, unspecified: Secondary | ICD-10-CM

## 2021-06-26 DIAGNOSIS — M5440 Lumbago with sciatica, unspecified side: Secondary | ICD-10-CM

## 2021-06-26 DIAGNOSIS — R937 Abnormal findings on diagnostic imaging of other parts of musculoskeletal system: Secondary | ICD-10-CM

## 2021-06-27 ENCOUNTER — Encounter: Payer: Self-pay | Admitting: Registered Nurse

## 2021-07-01 MED ORDER — OXYCODONE HCL 5 MG PO TABS: 5.0000 mg | ORAL_TABLET | ORAL | 0 refills | Status: DC | PRN

## 2021-07-03 ENCOUNTER — Other Ambulatory Visit: Payer: Self-pay | Admitting: Registered Nurse

## 2021-07-03 DIAGNOSIS — G8929 Other chronic pain: Secondary | ICD-10-CM

## 2021-07-05 NOTE — Telephone Encounter (Signed)
Please advise 

## 2021-07-08 ENCOUNTER — Other Ambulatory Visit: Payer: Self-pay | Admitting: Registered Nurse

## 2021-07-31 ENCOUNTER — Other Ambulatory Visit: Payer: Self-pay | Admitting: Registered Nurse

## 2021-07-31 DIAGNOSIS — R937 Abnormal findings on diagnostic imaging of other parts of musculoskeletal system: Secondary | ICD-10-CM

## 2021-07-31 DIAGNOSIS — M5440 Lumbago with sciatica, unspecified side: Secondary | ICD-10-CM

## 2021-07-31 DIAGNOSIS — M545 Low back pain, unspecified: Secondary | ICD-10-CM

## 2021-08-01 MED ORDER — OXYCODONE HCL 5 MG PO TABS: 5.0000 mg | ORAL_TABLET | ORAL | 0 refills | Status: DC | PRN

## 2021-08-01 NOTE — Telephone Encounter (Signed)
Patient is requesting a refill of the following medications: ?Requested Prescriptions  ? ?Pending Prescriptions Disp Refills  ? oxyCODONE (ROXICODONE) 5 MG immediate release tablet 60 tablet 0  ?  Sig: Take 1-2 tablets (5-10 mg total) by mouth every 4 (four) hours as needed for severe pain.  ? ? ?Date of patient request: 07/31/2021 ?Last office visit: 04/24/2021 ?Date of last refill: 07/06/2021 ?Last refill amount: 60 ?Follow up time period per chart:   ?

## 2021-08-12 ENCOUNTER — Encounter: Payer: Self-pay | Admitting: Registered Nurse

## 2021-08-14 ENCOUNTER — Other Ambulatory Visit: Payer: Self-pay | Admitting: Registered Nurse

## 2021-08-14 DIAGNOSIS — M5440 Lumbago with sciatica, unspecified side: Secondary | ICD-10-CM

## 2021-08-14 DIAGNOSIS — R937 Abnormal findings on diagnostic imaging of other parts of musculoskeletal system: Secondary | ICD-10-CM

## 2021-08-14 DIAGNOSIS — M545 Low back pain, unspecified: Secondary | ICD-10-CM

## 2021-08-14 MED ORDER — OXYCODONE HCL 5 MG PO TABS: 5.0000 mg | ORAL_TABLET | ORAL | 0 refills | Status: AC | PRN

## 2021-08-27 DIAGNOSIS — Z3042 Encounter for surveillance of injectable contraceptive: Secondary | ICD-10-CM | POA: Diagnosis not present

## 2021-09-04 DIAGNOSIS — M5416 Radiculopathy, lumbar region: Secondary | ICD-10-CM | POA: Diagnosis not present

## 2021-09-05 ENCOUNTER — Other Ambulatory Visit: Payer: Self-pay | Admitting: Registered Nurse

## 2021-09-05 DIAGNOSIS — F988 Other specified behavioral and emotional disorders with onset usually occurring in childhood and adolescence: Secondary | ICD-10-CM

## 2021-09-05 NOTE — Telephone Encounter (Signed)
Patient is requesting a refill of the following medications: Requested Prescriptions   Pending Prescriptions Disp Refills   lisdexamfetamine (VYVANSE) 30 MG capsule 90 capsule 0    Sig: Take 1 capsule (30 mg total) by mouth daily.    Date of patient request: 09/05/2021 Last office visit: 11/30/2020 Date of last refill: 05/27/2021 Last refill amount: 90 capsule Follow up time period per chart: none

## 2021-09-10 ENCOUNTER — Other Ambulatory Visit: Payer: Self-pay | Admitting: Registered Nurse

## 2021-09-10 ENCOUNTER — Encounter: Payer: Self-pay | Admitting: Registered Nurse

## 2021-09-11 NOTE — Telephone Encounter (Signed)
FYI about pt concerns, appears she is upset and will be leaving our practice.

## 2021-09-25 DIAGNOSIS — M5416 Radiculopathy, lumbar region: Secondary | ICD-10-CM | POA: Diagnosis not present

## 2021-10-23 DIAGNOSIS — M5416 Radiculopathy, lumbar region: Secondary | ICD-10-CM | POA: Diagnosis not present

## 2021-10-24 DIAGNOSIS — M545 Low back pain, unspecified: Secondary | ICD-10-CM | POA: Diagnosis not present

## 2021-11-10 DIAGNOSIS — E559 Vitamin D deficiency, unspecified: Secondary | ICD-10-CM | POA: Diagnosis not present

## 2021-11-10 DIAGNOSIS — R5383 Other fatigue: Secondary | ICD-10-CM | POA: Diagnosis not present

## 2021-11-10 DIAGNOSIS — D539 Nutritional anemia, unspecified: Secondary | ICD-10-CM | POA: Diagnosis not present

## 2021-11-10 DIAGNOSIS — Z1159 Encounter for screening for other viral diseases: Secondary | ICD-10-CM | POA: Diagnosis not present

## 2021-11-10 DIAGNOSIS — Z131 Encounter for screening for diabetes mellitus: Secondary | ICD-10-CM | POA: Diagnosis not present

## 2021-11-10 DIAGNOSIS — Z0000 Encounter for general adult medical examination without abnormal findings: Secondary | ICD-10-CM | POA: Diagnosis not present

## 2021-11-10 DIAGNOSIS — M545 Low back pain, unspecified: Secondary | ICD-10-CM | POA: Diagnosis not present

## 2021-11-12 DIAGNOSIS — H66002 Acute suppurative otitis media without spontaneous rupture of ear drum, left ear: Secondary | ICD-10-CM | POA: Diagnosis not present

## 2021-11-12 DIAGNOSIS — Z30013 Encounter for initial prescription of injectable contraceptive: Secondary | ICD-10-CM | POA: Diagnosis not present

## 2021-11-12 DIAGNOSIS — Z6826 Body mass index (BMI) 26.0-26.9, adult: Secondary | ICD-10-CM | POA: Diagnosis not present

## 2021-11-12 DIAGNOSIS — H6122 Impacted cerumen, left ear: Secondary | ICD-10-CM | POA: Diagnosis not present

## 2021-11-25 DIAGNOSIS — R946 Abnormal results of thyroid function studies: Secondary | ICD-10-CM | POA: Diagnosis not present

## 2021-11-25 DIAGNOSIS — Z79899 Other long term (current) drug therapy: Secondary | ICD-10-CM | POA: Diagnosis not present

## 2021-11-25 DIAGNOSIS — H6693 Otitis media, unspecified, bilateral: Secondary | ICD-10-CM | POA: Diagnosis not present

## 2021-11-25 DIAGNOSIS — Z3200 Encounter for pregnancy test, result unknown: Secondary | ICD-10-CM | POA: Diagnosis not present

## 2021-11-25 DIAGNOSIS — Z1339 Encounter for screening examination for other mental health and behavioral disorders: Secondary | ICD-10-CM | POA: Diagnosis not present

## 2021-12-26 DIAGNOSIS — E0590 Thyrotoxicosis, unspecified without thyrotoxic crisis or storm: Secondary | ICD-10-CM | POA: Diagnosis not present

## 2021-12-26 DIAGNOSIS — B999 Unspecified infectious disease: Secondary | ICD-10-CM | POA: Diagnosis not present

## 2021-12-26 DIAGNOSIS — R197 Diarrhea, unspecified: Secondary | ICD-10-CM | POA: Diagnosis not present

## 2021-12-26 DIAGNOSIS — J111 Influenza due to unidentified influenza virus with other respiratory manifestations: Secondary | ICD-10-CM | POA: Diagnosis not present

## 2022-02-02 DIAGNOSIS — F331 Major depressive disorder, recurrent, moderate: Secondary | ICD-10-CM | POA: Diagnosis not present

## 2022-02-02 DIAGNOSIS — F419 Anxiety disorder, unspecified: Secondary | ICD-10-CM | POA: Diagnosis not present

## 2022-02-02 DIAGNOSIS — Z6825 Body mass index (BMI) 25.0-25.9, adult: Secondary | ICD-10-CM | POA: Diagnosis not present

## 2022-02-02 DIAGNOSIS — F902 Attention-deficit hyperactivity disorder, combined type: Secondary | ICD-10-CM | POA: Diagnosis not present

## 2022-02-12 DIAGNOSIS — Z3042 Encounter for surveillance of injectable contraceptive: Secondary | ICD-10-CM | POA: Diagnosis not present

## 2022-03-12 DIAGNOSIS — F419 Anxiety disorder, unspecified: Secondary | ICD-10-CM | POA: Diagnosis not present

## 2022-03-12 DIAGNOSIS — F902 Attention-deficit hyperactivity disorder, combined type: Secondary | ICD-10-CM | POA: Diagnosis not present

## 2022-03-12 DIAGNOSIS — Z6826 Body mass index (BMI) 26.0-26.9, adult: Secondary | ICD-10-CM | POA: Diagnosis not present

## 2022-03-12 DIAGNOSIS — F331 Major depressive disorder, recurrent, moderate: Secondary | ICD-10-CM | POA: Diagnosis not present

## 2023-03-13 IMAGING — DX DG LUMBAR SPINE COMPLETE 4+V
5 series · 5 of 5 positions shown · non-contrast
Comparison: No pertinent prior exams available for comparison.

CLINICAL DATA: Acute bilateral low back pain without sciatica
[id number].[id number] ([id number]). Additional history provided by technologist:
radiates to right buttocks down right leg and into right foot,
numbness in toes of right foot.

EXAM:
LUMBAR SPINE - COMPLETE 4+ VIEW

[l-spine ap]
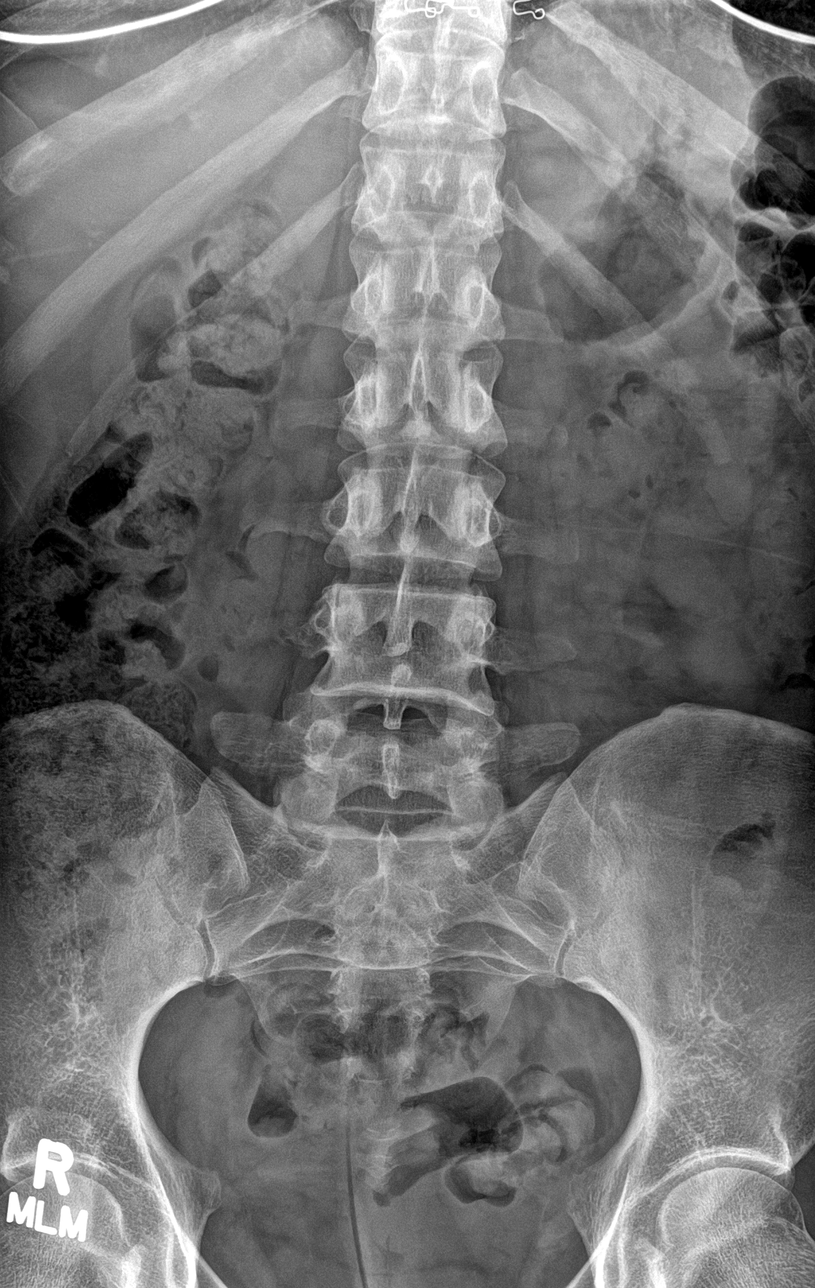

[l-spine obl (1 of 2)]
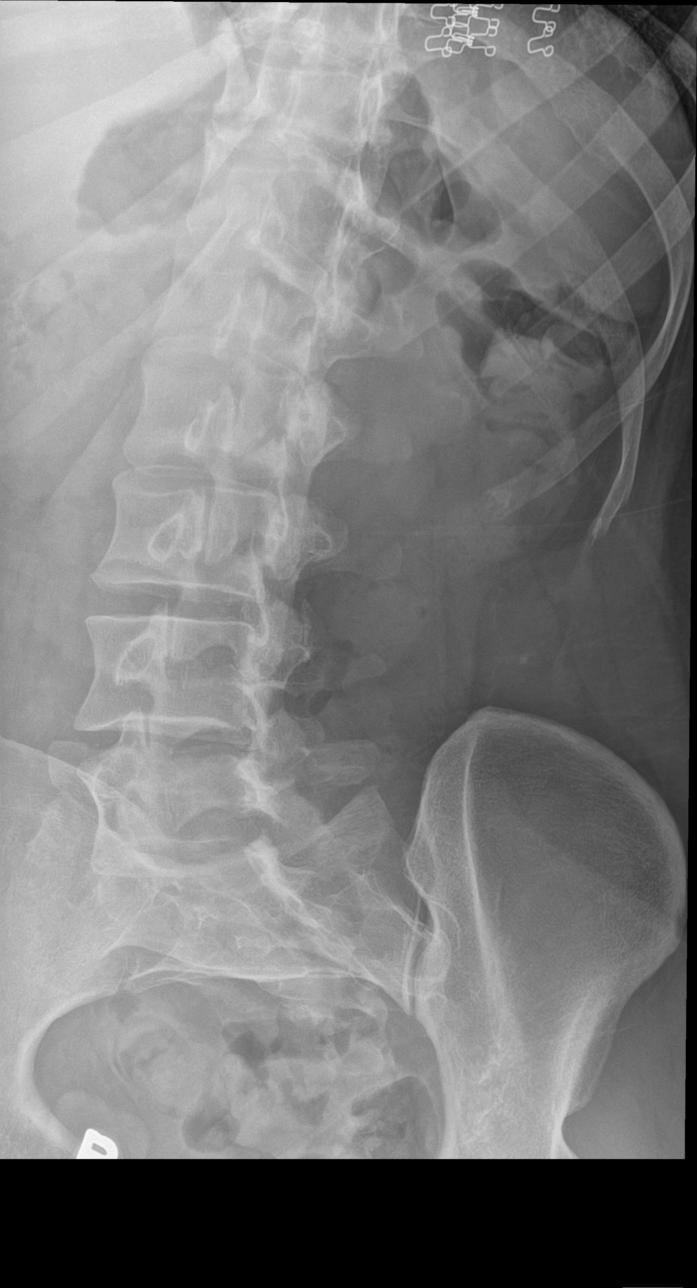

[l-spine obl (2 of 2)]
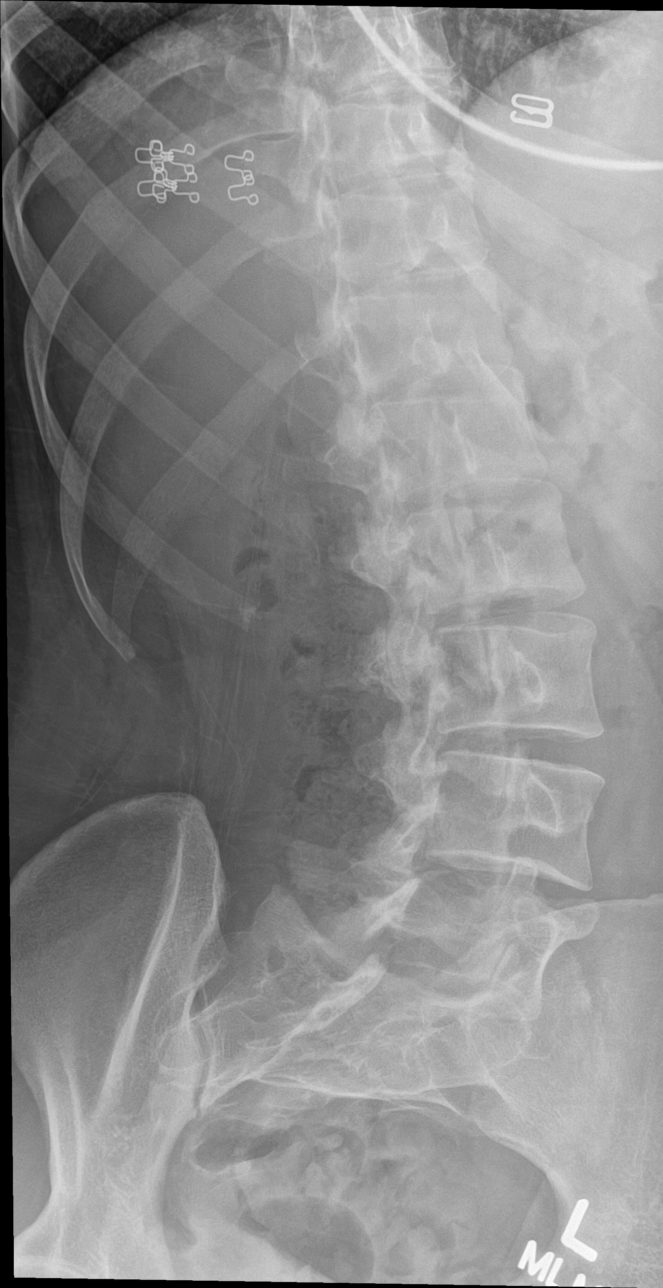

[l-spine lat]
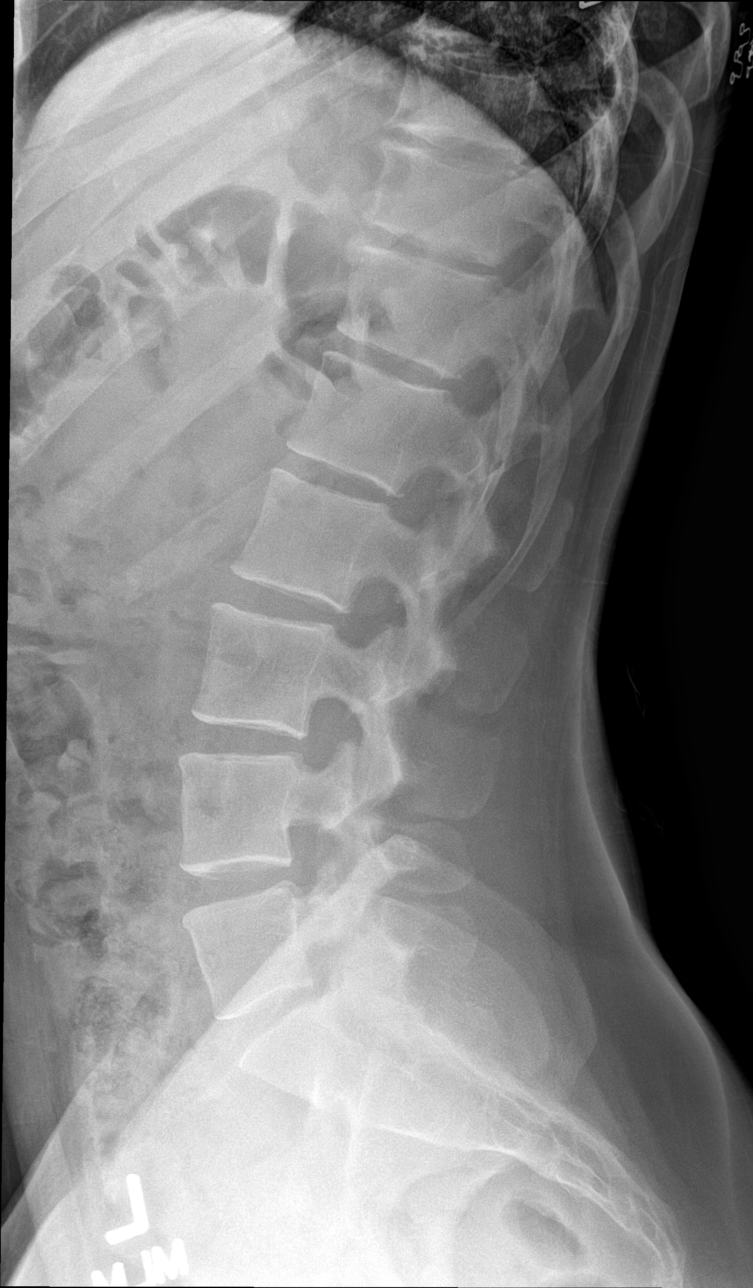

[l-spine spot]
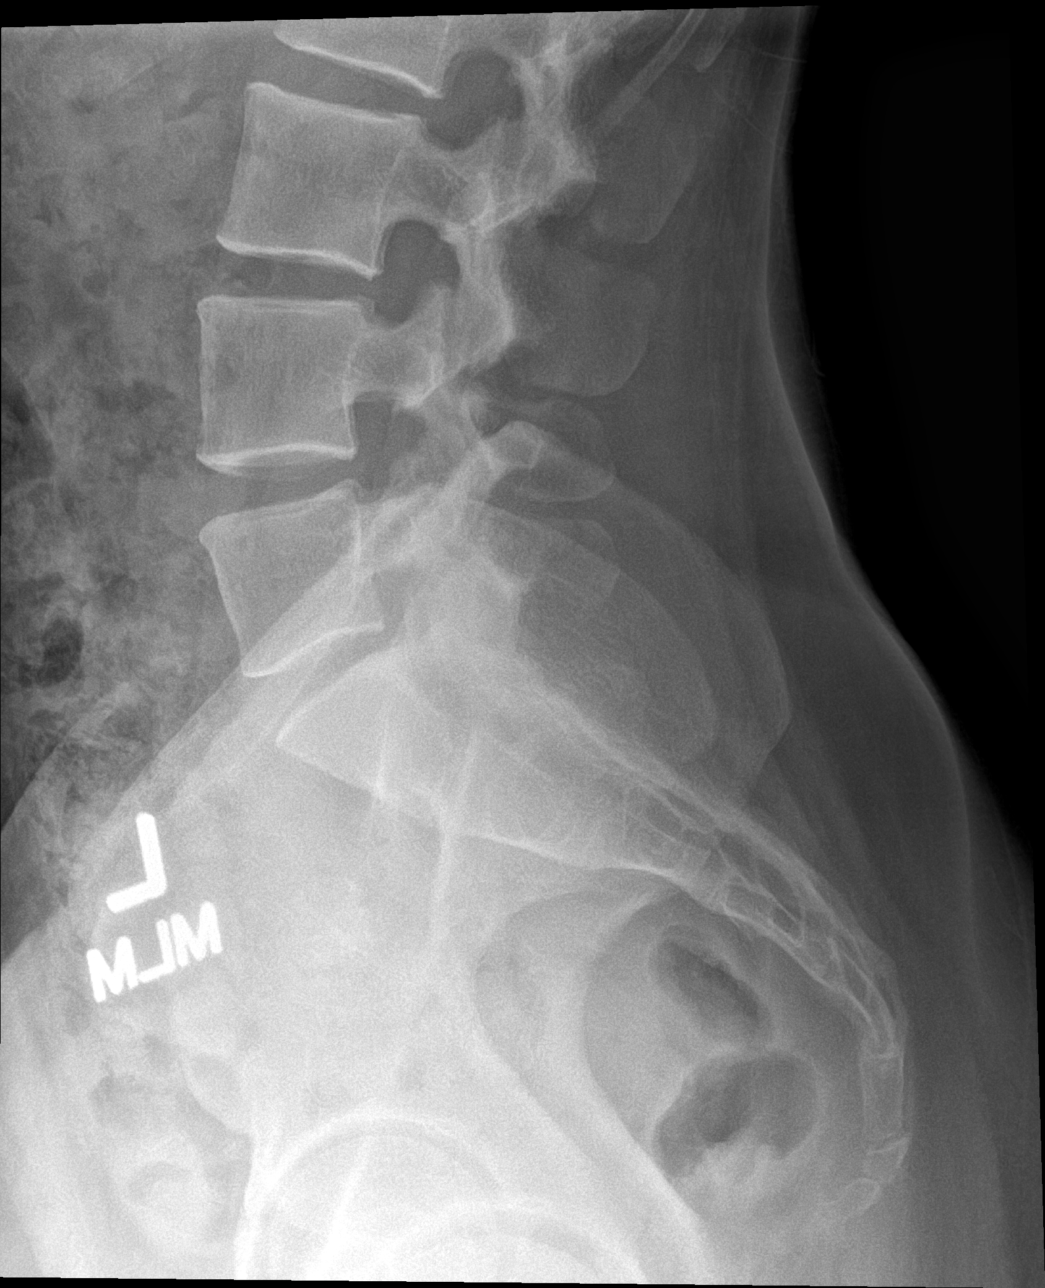

[5 of 5 positions shown; findings below may reference images not displayed]

FINDINGS: 5 lumbar vertebrae. The caudal most well-formed intervertebral disc
space is designated L5-S1.

Trace L1-L2 and L2-L3 grade 1 retrolisthesis.

No lumbar vertebral compression fracture. No appreciable pars
interarticularis defect. Abnormal, irregular and lucent appearance
of the right L4 transverse process.

The intervertebral disc spaces are maintained. Mild L5-S1 facet
arthrosis.

Impression #1 will be called to the ordering clinician or
representative by the Radiologist Assistant, and communication
documented in the PACS or [REDACTED].
IMPRESSION: Abnormal, irregular and lucent appearance of the right L4 transverse
process, concerning for a primary bone lesion or soft tissue mass
causing osseous erosion at this site. A CT of the lumbar spine is
recommended for initial further evaluation.

Trace L1-L2 and L2-L3 grade 1 retrolisthesis.

Mild L5-S1 facet arthrosis.
